# Patient Record
Sex: Male | Born: 1995 | Race: White | Hispanic: No | Marital: Single | Smoking: Never smoker
Health system: Southern US, Community
[De-identification: ages and names within clinical notes are randomized; demographics above are authoritative.]

## PROBLEM LIST (undated history)

## (undated) DIAGNOSIS — L708 Other acne: Secondary | ICD-10-CM

## (undated) DIAGNOSIS — S86899A Other injury of other muscle(s) and tendon(s) at lower leg level, unspecified leg, initial encounter: Secondary | ICD-10-CM

## (undated) DIAGNOSIS — T7840XA Allergy, unspecified, initial encounter: Secondary | ICD-10-CM

## (undated) DIAGNOSIS — S060X9A Concussion with loss of consciousness of unspecified duration, initial encounter: Secondary | ICD-10-CM

## (undated) DIAGNOSIS — L0591 Pilonidal cyst without abscess: Secondary | ICD-10-CM

## (undated) DIAGNOSIS — K5900 Constipation, unspecified: Secondary | ICD-10-CM

## (undated) DIAGNOSIS — F430 Acute stress reaction: Secondary | ICD-10-CM

## (undated) HISTORY — DX: Pilonidal cyst without abscess: L05.91

## (undated) HISTORY — DX: Acute stress reaction: F43.0

## (undated) HISTORY — DX: Other acne: L70.8

## (undated) HISTORY — DX: Other injury of other muscle(s) and tendon(s) at lower leg level, unspecified leg, initial encounter: S86.899A

## (undated) HISTORY — DX: Constipation, unspecified: K59.00

## (undated) HISTORY — DX: Allergy, unspecified, initial encounter: T78.40XA

## (undated) HISTORY — PX: PILONIDAL CYST EXCISION: SHX744

## (undated) HISTORY — DX: Concussion with loss of consciousness of unspecified duration, initial encounter: S06.0X9A

---
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
## 2016-02-26 ENCOUNTER — Ambulatory Visit (INDEPENDENT_AMBULATORY_CARE_PROVIDER_SITE_OTHER): Payer: BLUE CROSS/BLUE SHIELD | Admitting: Physician Assistant

## 2016-02-26 VITALS — BP 104/68 | HR 60 | Temp 98.7°F | Resp 16 | Ht 67.5 in | Wt 148.0 lb

## 2016-02-26 DIAGNOSIS — Z0000 Encounter for general adult medical examination without abnormal findings: Secondary | ICD-10-CM

## 2016-02-26 NOTE — Patient Instructions (Addendum)
Please get a flu vaccine in the next month.   IF you received an x-ray today, you will receive an invoice from [facility]. Please contact [facility] at [phone] with questions or concerns regarding your invoice.   IF you received labwork today, you will receive an invoice from [facility]. Please contact [facility] at [phone] with questions or concerns regarding your invoice.   Our billing staff will not be able to assist you with questions regarding bills from these companies.  You will be contacted with the lab results as soon as they are available. The fastest way to get your results is to activate your My Chart account. Instructions are located on the last page of this paperwork. If you have not heard from us regarding the results in 2 weeks, please contact this office.    Keeping you healthy  Get these tests  Blood pressure- Have your blood pressure checked once a year by your healthcare provider.  Normal blood pressure is 120/80.  Weight- Have your body mass index (BMI) calculated to screen for obesity.  BMI is a measure of body fat based on height and weight. You can also calculate your own BMI at https://www.west-esparza.com/www.nhlbisupport.com/bmi/.  Cholesterol- Have your cholesterol checked regularly starting at age 19, sooner may be necessary if you have diabetes, high blood pressure, if a family member developed heart diseases at an early age or if you smoke.   Chlamydia, HIV, and other sexual transmitted disease- Get screened each year until the age of 19 then within three months of each new sexual partner.  Diabetes- Have your blood sugar checked regularly if you have high blood pressure, high cholesterol, a family history of diabetes or if you are overweight.  Get these vaccines  Flu shot- Every fall.  Tetanus shot- Every 10 years.  Menactra- Single dose; prevents meningitis.  Take these steps  Don't smoke- If you do smoke, ask your  healthcare provider about quitting. For tips on how to quit, go to www.smokefree.gov or call 1-800-QUIT-NOW.  Be physically active- Exercise 5 days a week for at least 30 minutes.  If you are not already physically active start slow and gradually work up to 30 minutes of moderate physical activity.  Examples of moderate activity include walking briskly, mowing the yard, dancing, swimming bicycling, etc.  Eat a healthy diet- Eat a variety of healthy foods such as fruits, vegetables, low fat milk, low fat cheese, yogurt, lean meats, poultry, fish, beans, tofu, etc.  For more information on healthy eating, go to www.thenutritionsource.org  Drink alcohol in moderation- Limit alcohol intake two drinks or less a day.  Never drink and drive.  Dentist- Brush and floss teeth twice daily; visit your dentis twice a year.  Depression-Your emotional health is as important as your physical health.  If you're feeling down, losing interest in things you normally enjoy please talk with your healthcare provider.  Gun Safety- If you keep a gun in your home, keep it unloaded and with the safety lock on.  Bullets should be stored separately.  Helmet use- Always wear a helmet when riding a motorcycle, bicycle, rollerblading or skateboarding.  Safe sex- If you may be exposed to a sexually transmitted infection, use a condom  Seat belts- Seat bels can save your life; always wear one.  Smoke/Carbon Monoxide detectors- These detectors need to be installed on the appropriate level of your home.  Replace batteries at least once a year.  Skin Cancer- When out in  the sun, cover up and use sunscreen SPF 15 or higher.  Violence- If anyone is threatening or hurting you, please tell your healthcare provider.

## 2016-02-26 NOTE — Progress Notes (Signed)
Patient ID: Todd Reese, male    DOB: 1996-04-04, 20 y.o.   MRN: [id number]  PCP: Nilda Simmer, MD  Chief Complaint  Patient presents with  . Annual Exam    and sports form    Subjective:   HPI: Presents for Todd Reese. He needs a form completed for intercollegiate sports participation.  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  Last wellness exam was 01/2015.  Colorectal Cancer Screening: not yet a candidate. Prostate Cancer Screening: not yet a candidate. Bone Density Testing: not yet a candidate. HIV Screening: not yet STI Screening: very low risk-monogamous relationship x 2 years Seasonal Influenza Vaccination: declines Td/Tdap Vaccination: 2009 HPV vaccination series complete Meningococcal vaccination series complete Pneumococcal Vaccination: not yet a candidate. Zoster Vaccination: not yet a candidate.   There are no active problems to display for this patient.   Past Medical History:  Diagnosis Date  . Allergy    Claritin.  . Concussion 07/05/2011  . Other acne   . 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 [facility]. Doristine Section MD. Follow up. Assessment: Improvement in periostitis tibiae. Physical therapy do some conditioning on his legs, but no running, and continue modalities. No running and we are going to probably return him in two weeks. Recheck 03/24/2012.   Marland Kitchen Pilonidal cyst without abscess 04/23/2012  . Stress reaction 04/23/2012   Multiple family stressors in past six months including death of maternal grandmother, development of major depressive episode in mother.     Marland Kitchen Unspecified constipation      Prior to Admission medications   Medication Sig Start Date End Date Taking? Authorizing Provider  ibuprofen (ADVIL,MOTRIN) 200 MG tablet Take 200 mg by mouth every 6 (six) hours as needed.   Yes Historical Provider, MD    No Known Allergies  Past Surgical History:  Procedure  Laterality Date  . MASTOIDECTOMY  2004   Right  . recircumscision  2008    Family History  Problem Relation Age of Onset  . Depression Mother   . Irritable bowel syndrome Mother   . Depression Father   . Heart disease Father   . Hyperlipidemia Father   . Sleep apnea Father   . Colon polyps Father   . ADD / ADHD Sister   . Headache Sister   . Scoliosis Sister     Social History   Social History  . Marital status: Single    Spouse name: n/a  . Number of children: 0  . Years of education: N/A   Occupational History  . student     Product manager   Social History Main Topics  . Smoking status: Never Smoker  . Smokeless tobacco: Never Used  . Alcohol use No  . Drug use: No  . Sexual activity: Yes    Partners: Female    Birth control/ protection: Condom   Other Topics Concern  . None   Social History Narrative   Marital status: Single; + dating Megan from 2015.      Lives: joint custody with mom and dad.      Education: [employer]; transfer to [employer].  Career plans business and basketball training.      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      Seatbelt: driving; seatbelt 409%; no  texting.      Sexual active: yes; +condoms.  No birth control with current partner other than condoms.  Total partners = 2.             Review of Systems  Constitutional: Negative.        "I stay hungry"  HENT: Negative.   Eyes: Negative.   Respiratory: Negative.   Cardiovascular: Negative.   Gastrointestinal: Negative.   Endocrine: Negative.   Genitourinary: Negative.   Musculoskeletal: Positive for back pain (since a significant back spasm in 8th grade). Negative for arthralgias, gait problem, joint swelling, myalgias, neck pain and neck stiffness.  Skin: Negative.   Allergic/Immunologic: Negative.   Neurological: Negative.   Hematological: Negative.   Psychiatric/Behavioral: Negative.           Objective:  Physical Exam  Constitutional: He is oriented to person, place, and time. He appears well-developed and well-nourished. He is active and cooperative.  Non-toxic appearance. He does not have a sickly appearance. He does not appear ill. No distress.  BP 104/68 (BP Location: Right Arm, Patient Position: Sitting, Cuff Size: Normal)   Pulse 60   Temp 98.7 F (37.1 C) (Oral)   Resp 16   Ht 5' 7.5" (1.715 m)   Wt 148 lb (67.1 kg)   SpO2 97%   BMI 22.84 kg/m    HENT:  Head: Normocephalic and atraumatic.  Right Ear: Hearing, tympanic membrane, external ear and ear canal normal.  Left Ear: Hearing, tympanic membrane, external ear and ear canal normal.  Nose: Nose normal.  Mouth/Throat: Uvula is midline, oropharynx is clear and moist and mucous membranes are normal. He does not have dentures. No oral lesions. No trismus in the jaw. Normal dentition. No dental abscesses, uvula swelling, lacerations or dental caries.  Eyes: Conjunctivae, EOM and lids are normal. Pupils are equal, round, and reactive to light. Right eye exhibits no discharge. Left eye exhibits no discharge. No scleral icterus.  Fundoscopic exam:      The right eye shows no arteriolar narrowing, no AV nicking, no exudate, no hemorrhage and no papilledema.       The left eye shows no arteriolar narrowing, no AV nicking, no exudate, no hemorrhage and no papilledema.  Neck: Normal range of motion, full passive range of motion without pain and phonation normal. Neck supple. No spinous process tenderness and no muscular tenderness present. No neck rigidity. No tracheal deviation, no edema, no erythema and normal range of motion present. No thyromegaly present.  Cardiovascular: Normal rate, regular rhythm, S1 normal, S2 normal, normal heart sounds, intact distal pulses and normal pulses.  Exam reveals no gallop and no friction rub.   No murmur heard. Pulmonary/Chest: Effort normal and breath sounds normal. No  respiratory distress. He has no wheezes. He has no rales.  Abdominal: Soft. Normal appearance and bowel sounds are normal. He exhibits no distension and no mass. There is no hepatosplenomegaly. There is no tenderness. There is no rebound and no guarding. No hernia. Hernia confirmed negative in the right inguinal area and confirmed negative in the left inguinal area.  Genitourinary: Testes normal and penis normal. Circumcised.  Musculoskeletal: Normal range of motion. He exhibits no edema or tenderness.       Right shoulder: Normal.       Left shoulder: Normal.       Right elbow: Normal.      Left elbow: Normal.       Right wrist: Normal.  Left wrist: Normal.       Right hip: Normal.       Left hip: Normal.       Right knee: Normal.       Left knee: Normal.       Right ankle: Normal. Achilles tendon normal.       Left ankle: Normal. Achilles tendon normal.       Cervical back: Normal. He exhibits normal range of motion, no tenderness, no bony tenderness, no swelling, no edema, no deformity, no laceration, no pain, no spasm and normal pulse.       Thoracic back: Normal.       Lumbar back: Normal.       Right hand: Normal.       Left hand: Normal.       Right foot: Normal.       Left foot: Normal.  Normal duck walking and single leg hop. Hamstrings are tight.  Lymphadenopathy:       Head (right side): No submental, no submandibular, no tonsillar, no preauricular, no posterior auricular and no occipital adenopathy present.       Head (left side): No submental, no submandibular, no tonsillar, no preauricular, no posterior auricular and no occipital adenopathy present.    He has no cervical adenopathy.       Right: No inguinal and no supraclavicular adenopathy present.       Left: No inguinal and no supraclavicular adenopathy present.  Neurological: He is alert and oriented to person, place, and time. He has normal strength and normal reflexes. He displays no tremor. No cranial nerve  deficit or sensory deficit. He exhibits normal muscle tone. Coordination and gait normal.  Reflex Scores:      Bicep reflexes are 2+ on the right side and 2+ on the left side.      Patellar reflexes are 2+ on the right side and 2+ on the left side.      Achilles reflexes are 2+ on the right side and 2+ on the left side. Skin: Skin is warm, dry and intact. No abrasion, no ecchymosis, no laceration, no lesion and no rash noted. He is not diaphoretic. No cyanosis or erythema. No pallor. Nails show no clubbing.  Psychiatric: He has a normal mood and affect. His speech is normal and behavior is normal. Judgment and thought content normal. Cognition and memory are normal.       Wt Readings from Last 3 Encounters:  02/26/16 148 lb (67.1 kg) (41 %, Z= -0.22)*  01/23/15 151 lb 6.4 oz (68.7 kg) (54 %, Z= 0.11)*  06/03/13 155 lb 9.6 oz (70.6 kg) (74 %, Z= 0.66)*   * Growth percentiles are based on CDC 2-20 Years data.     Visual Acuity Screening   Right eye Left eye Both eyes  Without correction: 20/20 20/20 20/20   With correction:          Assessment & Plan:  1. Annual physical exam Age appropriate anticipatory guidance provided. Sports form completed. No limitations.    Fernande Bras, PA-C Physician Assistant-Certified [facility]

## 2016-05-06 ENCOUNTER — Ambulatory Visit (INDEPENDENT_AMBULATORY_CARE_PROVIDER_SITE_OTHER): Payer: BLUE CROSS/BLUE SHIELD

## 2016-05-06 ENCOUNTER — Encounter (HOSPITAL_COMMUNITY): Payer: Self-pay | Admitting: Emergency Medicine

## 2016-05-06 ENCOUNTER — Ambulatory Visit (HOSPITAL_COMMUNITY)
Admission: EM | Admit: 2016-05-06 | Discharge: 2016-05-06 | Disposition: A | Discharge status: Home / Self Care | Payer: BLUE CROSS/BLUE SHIELD | Attending: Family Medicine | Admitting: Family Medicine

## 2016-05-06 DIAGNOSIS — S6992XA Unspecified injury of left wrist, hand and finger(s), initial encounter: Secondary | ICD-10-CM | POA: Diagnosis not present

## 2016-05-06 MED ORDER — NAPROXEN 500 MG PO TABS: 500.0000 mg | ORAL_TABLET | Freq: Two times a day (BID) | ORAL | 0 refills | Status: DC

## 2016-05-06 NOTE — ED Triage Notes (Signed)
Left hand injury.  Reports playing basketball.  Patient reports hitting left index finger approximately 4 different times over 2 months.  Most recent event happened today.  Now has swelling, pain, and limited mobility of this finger

## 2016-05-06 NOTE — ED Provider Notes (Signed)
CSN: [id number]     Arrival date & time 05/06/16  1911 History   First MD Initiated Contact with Patient 05/06/16 1958     Chief Complaint  Patient presents with  . Finger Injury   (Consider location/radiation/quality/duration/timing/severity/associated sxs/prior Treatment) Patient has sustained injury to left index finger and hand and now has a swollen index finger knuckle and decreased ROM right second finger.   The history is provided by the patient.  Hand Pain  This is a new problem. The current episode started 6 to 12 hours ago. The problem occurs constantly. The problem has not changed since onset.The symptoms are aggravated by bending and exertion. The symptoms are relieved by rest. He has tried nothing for the symptoms.    Past Medical History:  Diagnosis Date  . Allergy    Claritin.  . Concussion 07/05/2011  . Other acne   . 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 [facility]. Doristine SectionVincent Paul MD. Follow up. Assessment: Improvement in periostitis tibiae. Physical therapy do some conditioning on his legs, but no running, and continue modalities. No running and we are going to probably return him in two weeks. Recheck 03/24/2012.   Marland Kitchen. Pilonidal cyst without abscess 04/23/2012  . Stress reaction 04/23/2012   Multiple family stressors in past six months including death of maternal grandmother, development of major depressive episode in mother.     Marland Kitchen. Unspecified constipation    Past Surgical History:  Procedure Laterality Date  . MASTOIDECTOMY  2004   Right  . recircumscision  2008   Family History  Problem Relation Age of Onset  . Depression Mother   . Irritable bowel syndrome Mother   . Depression Father   . Heart disease Father   . Hyperlipidemia Father   . Sleep apnea Father   . Colon polyps Father   . ADD / ADHD Sister   . Headache Sister   . Scoliosis Sister    Social History  Substance Use Topics  . Smoking status: Never  Smoker  . Smokeless tobacco: Never Used  . Alcohol use No    Review of Systems  Constitutional: Negative.   HENT: Negative.   Eyes: Negative.   Respiratory: Negative.   Cardiovascular: Negative.   Gastrointestinal: Negative.   Endocrine: Negative.   Genitourinary: Negative.   Musculoskeletal: Positive for joint swelling.  Skin: Negative.   Allergic/Immunologic: Negative.   Neurological: Negative.   Hematological: Negative.   Psychiatric/Behavioral: Negative.     Allergies  Patient has no known allergies.  Home Medications   Prior to Admission medications   Medication Sig Start Date End Date Taking? Authorizing Provider  ibuprofen (ADVIL,MOTRIN) 200 MG tablet Take 200 mg by mouth every 6 (six) hours as needed.    Historical Provider, MD   Meds Ordered and Administered this Visit  Medications - No data to display  BP 133/79 (BP Location: Right Arm)   Pulse (!) 55   Temp 98.5 F (36.9 C) (Oral)   Resp 16   SpO2 100%  No data found.   Physical Exam  Constitutional: He is oriented to person, place, and time. He appears well-developed and well-nourished.  HENT:  Head: Normocephalic and atraumatic.  Right Ear: External ear normal.  Left Ear: External ear normal.  Mouth/Throat: Oropharynx is clear and moist.  Eyes: EOM are normal. Pupils are equal, round, and reactive to light.  Neck: Normal range of motion. Neck supple.  Cardiovascular: Normal rate, regular rhythm and normal heart  sounds.   Pulmonary/Chest: Effort normal and breath sounds normal.  Abdominal: Soft. Bowel sounds are normal.  Musculoskeletal:  Left second MTP swollen and decreased ROM Left hand  Neurological: He is alert and oriented to person, place, and time.  Nursing note and vitals reviewed.   Urgent Care Course   Clinical Course     Procedures (including critical care time)  Labs Review Labs Reviewed - No data to display  Imaging Review No results found.   Visual Acuity  Review  Right Eye Distance:   Left Eye Distance:   Bilateral Distance:    Right Eye Near:   Left Eye Near:    Bilateral Near:         MDM  Left second finger sprain  Naprosyn 500mg  one po bid x 7 days #14 [address] Street[address] Tape      Deatra CanterWilliam J Oxford, FNP 05/06/16 2039

## 2016-05-06 NOTE — Discharge Instructions (Signed)
Finger Sprain  A finger sprain is an injury to one of the strong bands of tissue (ligaments) that connect the bones in the finger. The ligament can be stretched too much, or it can tear. A tear can be either partial or complete. The severity of the sprain depends on how much of the ligament was damaged or torn.  CAUSES  This injury is often caused by a fall or an accident. For example, if you extend your hands to catch an object or to protect yourself during a fall, the force of impact may cause the ligaments in your finger to stretch too much.  RISK FACTORS  The following factors may make you more likely to have this injury:   Playing sports that involve a greater risk of falling, such as skiing.   Playing sports that involve catching an object, such as basketball.   Having poor strength and flexibility.  SYMPTOMS  Symptoms of this condition include:   Pain at the affected finger joint, especially when bending or extending the finger.   Loss of motion in the finger.   Swelling.   Tenderness.   Bruising.  DIAGNOSIS  This condition is diagnosed with a medical history and physical exam. You may also have an X-ray of your finger to rule out a fracture or dislocation.  TREATMENT  Treatment varies depending on the severity of the sprain. If your ligament is overstretched or partially torn, treatment usually involves:   Keeping the finger in a fixed position (immobilization) for a period of time. To help you do this, your health care provider may apply a bandage, splint, or cast to keep the finger from moving until it heals. In some cases, the finger may be taped to the fingers beside it (buddy taping).   Taking medicines for pain.   Doing exercises for the finger after it has begun to heal.  If your ligament is fully torn, you may need surgery to reconnect the ligament to the bone. After surgery, a cast or splint will be applied.  HOME CARE INSTRUCTIONS  If You Have a Splint:   Wear the splint as told by  your health care provider. Remove it only as told by your health care provider.   Loosen the splint if your fingers tingle, become numb, or turn cold and blue.   Do not let your splint get wet if it is not waterproof.   Keep the splint clean.  If You Have a Cast:   Do not stick anything inside the cast to scratch your skin. Doing that increases your risk of infection.   Check the skin around the cast every day. Tell your health care provider about any concerns.   You may put lotion on dry skin around the edges of the cast. Do not put lotion on the skin underneath the cast.   Do not let your cast get wet if it is not waterproof.   Keep the cast clean.  Bathing   If your splint or cast is not waterproof, cover it with a watertight plastic bag when you take a bath or a shower.   Keep any bandages (dressings) dry until your health care provider says they can be removed.  Managing Pain, Stiffness, and Swelling   If directed, put ice on the injured area:  ? Put ice in a plastic bag.  ? Place a towel between your skin and the bag.  ? Leave the ice on for 20 minutes, 2-3 times a day.     Move your fingers often to avoid stiffness and to lessen swelling.   Raise (elevate) the injured area above the level of your heart while you are sitting or lying down.  General Instructions   Do not put pressure on any part of the cast or splint until it is fully hardened. This may take several hours.   Take over-the-counter and prescription medicines only as told by your health care provider.   Do not drive or operate heavy machinery while taking prescription pain medicine.   Do exercises as told by your health care provider or physical therapist.   Do not wear rings on your injured finger.   Keep all follow-up visits as told by your health care provider. This is important.  SEEK MEDICAL CARE IF:   Your pain is not controlled with medicine.   Your bruising or swelling gets worse.   Your cast or splint is  damaged.   Your finger is numb or blue.   Your finger feels colder than normal.  This information is not intended to replace advice given to you by your health care provider. Make sure you discuss any questions you have with your health care provider.  Document Released: 06/27/2004 Document Revised: 09/11/2015 Document Reviewed: 03/30/2015  Elsevier Interactive Patient Education  2017 [employer]vier Inc[employer]

## 2016-09-03 ENCOUNTER — Telehealth: Payer: Self-pay | Admitting: Family Medicine

## 2016-09-03 NOTE — Telephone Encounter (Signed)
Mother requesting copy of immunization record. Printed and provided.

## 2017-02-24 ENCOUNTER — Ambulatory Visit (HOSPITAL_COMMUNITY)
Admission: EM | Admit: 2017-02-24 | Discharge: 2017-02-24 | Disposition: A | Discharge status: Home / Self Care | Payer: BLUE CROSS/BLUE SHIELD | Attending: Family Medicine | Admitting: Family Medicine

## 2017-02-24 ENCOUNTER — Emergency Department (HOSPITAL_COMMUNITY)
Admission: EM | Admit: 2017-02-24 | Discharge: 2017-02-24 | Disposition: A | Discharge status: Home / Self Care | Payer: BLUE CROSS/BLUE SHIELD | Attending: Emergency Medicine | Admitting: Emergency Medicine

## 2017-02-24 ENCOUNTER — Encounter (HOSPITAL_COMMUNITY): Payer: Self-pay | Admitting: Emergency Medicine

## 2017-02-24 ENCOUNTER — Emergency Department (HOSPITAL_COMMUNITY): Payer: BLUE CROSS/BLUE SHIELD

## 2017-02-24 DIAGNOSIS — Z5321 Procedure and treatment not carried out due to patient leaving prior to being seen by health care provider: Secondary | ICD-10-CM | POA: Diagnosis not present

## 2017-02-24 DIAGNOSIS — Y9361 Activity, american tackle football: Secondary | ICD-10-CM | POA: Diagnosis not present

## 2017-02-24 DIAGNOSIS — S20211A Contusion of right front wall of thorax, initial encounter: Secondary | ICD-10-CM | POA: Diagnosis not present

## 2017-02-24 DIAGNOSIS — R0781 Pleurodynia: Secondary | ICD-10-CM | POA: Diagnosis present

## 2017-02-24 NOTE — ED Triage Notes (Signed)
Patient having pain in right rib area after being kneed in the right ribs.  Patient complaining of 9/10 pain.

## 2017-02-24 NOTE — ED Triage Notes (Signed)
Patient reports when playing football last night, he took a knee to the right ribcage.  Denies sob.  Reports slight pain, and family member feels a knot at edge of ribcage that she is concerned about.  Patient is very limited on responses.  Patient not contributing much to assessment.  Patient did go to ed last night and had an xray, but did not see anyone due to tine in department

## 2017-02-24 NOTE — ED Provider Notes (Signed)
[facility]   [id number] 02/24/17 Arrival Time: 1133  ASSESSMENT & PLAN:  1. Rib contusion, right, initial encounter    Ibuprofen with food for the next week. Activity as tolerated. May f/u as needed. No fractures seen on x-rays. Reviewed expectations re: course of current medical issues. Questions answered. Outlined signs and symptoms indicating need for more acute intervention. Patient verbalized understanding. After Visit Summary given.   SUBJECTIVE:  Todd Reese is a 21 y.o. male who presents with complaint of R sided rib pain. Playing flag football yesterday. Knee to R ribs. Immediate pain. Better this am. Micah Flesher to ED last evening where he had x-rays done. Left due to waiting time. No SOB or trouble breathing. Moderate discomfort with certain movements. Ibuprofen with mild help.  ROS: As per HPI.   OBJECTIVE:  Vitals:   02/24/17 1318  BP: 114/64  Pulse: (!) 46  Resp: 16  Temp: 98 F (36.7 C)  TempSrc: Oral  SpO2: 98%    General appearance: alert; no distress Lungs: clear to auscultation bilaterally Heart: regular rate and rhythm Chest wall: tender over R lower anterior ribs; no bruising or signs of trauma Abdomen: soft, non-tender Skin: warm and dry Psychological: alert and cooperative; normal mood and affect  Imaging: Dg Ribs Unilateral W/chest Right  Result Date: 02/24/2017 CLINICAL DATA:  Right rib pain after being need in the right ribs tonight. Site of pain marked by a BB. EXAM: RIGHT RIBS AND CHEST - 3+ VIEW COMPARISON:  None. FINDINGS: No fracture or other bone lesions are seen involving the ribs. There is no evidence of pneumothorax or pleural effusion. Both lungs are clear. Heart size and mediastinal contours are within normal limits. IMPRESSION: No acute displaced rib fracture. Clear lungs without pneumothorax or pulmonary consolidation. Electronically Signed   By: Tollie Eth M.D.   On: 02/24/2017 00:35   No Known Allergies  Past  Medical History:  Diagnosis Date  . Allergy    Claritin.  . Concussion 07/05/2011  . Other acne   . 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 [facility]. Doristine Section MD. Follow up. Assessment: Improvement in periostitis tibiae. Physical therapy do some conditioning on his legs, but no running, and continue modalities. No running and we are going to probably return him in two weeks. Recheck 03/24/2012.   Marland Kitchen Pilonidal cyst without abscess 04/23/2012  . Stress reaction 04/23/2012   Multiple family stressors in past six months including death of maternal grandmother, development of major depressive episode in mother.     Marland Kitchen Unspecified constipation    Social History   Social History  . Marital status: Single    Spouse name: n/a  . Number of children: 0  . Years of education: N/A   Occupational History  . student     Product manager   Social History Main Topics  . Smoking status: Never Smoker  . Smokeless tobacco: Never Used  . Alcohol use No  . Drug use: No  . Sexual activity: Yes    Partners: Female    Birth control/ protection: Condom   Other Topics Concern  . Not on file   Social History Narrative   Marital status: Single; + dating Megan from 33.      Lives: joint custody with mom and dad.      Education: [employer]; transfer to [employer].  Career plans business and basketball training.      Employment: none      Tobacco:  none      Alcohol: none      Drugs: none      Exercise: [employer]; trainer; six days per week.      Seatbelt: driving; seatbelt 161%; no texting.      Sexual active: yes; +condoms.  No birth control with current partner other than condoms.  Total partners = 2.         Family History  Problem Relation Age of Onset  . Depression Mother   . Irritable bowel syndrome Mother   . Depression Father   . Heart disease Father   . Hyperlipidemia Father   . Sleep apnea Father   . Colon polyps Father   . ADD / ADHD Sister   .  Headache Sister   . Scoliosis Sister    Past Surgical History:  Procedure Laterality Date  . MASTOIDECTOMY  2004   Right  . recircumscision  2008     Mardella Layman, MD 02/24/17 [phone]

## 2017-02-24 NOTE — ED Notes (Signed)
Pt states that he is not going to stay and will see his PCP in the morning.

## 2017-02-25 ENCOUNTER — Ambulatory Visit: Payer: BLUE CROSS/BLUE SHIELD | Admitting: Physician Assistant

## 2017-10-17 ENCOUNTER — Other Ambulatory Visit: Payer: Self-pay

## 2017-10-17 ENCOUNTER — Emergency Department
Admission: EM | Admit: 2017-10-17 | Discharge: 2017-10-17 | Disposition: A | Discharge status: Home / Self Care | Payer: BLUE CROSS/BLUE SHIELD | Attending: Emergency Medicine | Admitting: Emergency Medicine

## 2017-10-17 ENCOUNTER — Encounter: Payer: Self-pay | Admitting: Family Medicine

## 2017-10-17 DIAGNOSIS — L0501 Pilonidal cyst with abscess: Secondary | ICD-10-CM | POA: Diagnosis not present

## 2017-10-17 DIAGNOSIS — R222 Localized swelling, mass and lump, trunk: Secondary | ICD-10-CM | POA: Diagnosis present

## 2017-10-17 MED ORDER — CLINDAMYCIN HCL 300 MG PO CAPS: 300.0000 mg | ORAL_CAPSULE | Freq: Three times a day (TID) | ORAL | 0 refills | Status: AC

## 2017-10-17 MED ORDER — OXYCODONE-ACETAMINOPHEN 5-325 MG PO TABS: 1.0000 | ORAL_TABLET | ORAL | 0 refills | Status: AC | PRN

## 2017-10-17 NOTE — ED Provider Notes (Signed)
[facility] Emergency Department Provider Note  ____________________________________________  Time seen: Approximately 7:00 PM  I have reviewed the triage vital signs and the nursing notes.   HISTORY  Chief Complaint Cyst    HPI Todd Reese is a 22 y.o. male that presents to the emergency department for evaluation for evaluation of pilonidal abscess for 2 days. 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 No fevers, chills, nausea, vomiting, abdominal pain, drainage.    Past Medical History:  Diagnosis Date  . Allergy    Claritin.  . Concussion 07/05/2011  . Other acne   . 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 [facility]. Doristine Section MD. Follow up. Assessment: Improvement in periostitis tibiae. Physical therapy do some conditioning on his legs, but no running, and continue modalities. No running and we are going to probably return him in two weeks. Recheck 03/24/2012.   Marland Kitchen Pilonidal cyst without abscess 04/23/2012  . Stress reaction 04/23/2012   Multiple family stressors in past six months including death of maternal grandmother, development of major depressive episode in mother.     Marland Kitchen Unspecified constipation     There are no active problems to display for this patient.   Past Surgical History:  Procedure Laterality Date  . MASTOIDECTOMY  2004   Right  . recircumscision  2008    Prior to Admission medications   Medication Sig Start Date End Date Taking? Authorizing Provider  clindamycin (CLEOCIN) 300 MG capsule Take 1 capsule (300 mg total) by mouth 3 (three) times daily for 10 days. 10/17/17 10/27/17  Enid Derry, PA-C  ibuprofen (ADVIL,MOTRIN) 200 MG tablet Take 200 mg by mouth every 6 (six) hours as needed.    [provider]  naproxen (NAPROSYN) 500 MG tablet Take 1 tablet (500 mg total) by mouth 2 (two) times daily with a meal. 05/06/16   Oxford, Anselm Pancoast,  FNP  oxyCODONE-acetaminophen (PERCOCET) 5-325 MG tablet Take 1 tablet by mouth every 4 (four) hours as needed for severe pain. 10/17/17 10/17/18  Enid Derry, PA-C    Allergies Patient has no known allergies.  Family History  Problem Relation Age of Onset  . Depression Mother   . Irritable bowel syndrome Mother   . Depression Father   . Heart disease Father   . Hyperlipidemia Father   . Sleep apnea Father   . Colon polyps Father   . ADD / ADHD Sister   . Headache Sister   . Scoliosis Sister     Social History Social History   Tobacco Use  . Smoking status: Never Smoker  . Smokeless tobacco: Never Used  Substance Use Topics  . Alcohol use: No  . Drug use: No     Review of Systems  Constitutional: No fever/chills Gastrointestinal: No abdominal pain.  No nausea, no vomiting.  Musculoskeletal: Negative for musculoskeletal pain. Skin: Negative for rash, abrasions, lacerations, ecchymosis.   ____________________________________________   PHYSICAL EXAM:  VITAL SIGNS: ED Triage Vitals  Enc Vitals Group     BP 10/17/17 1802 (!) 130/42     Pulse Rate 10/17/17 1802 (!) 52     Resp 10/17/17 1802 16     Temp 10/17/17 1802 98.5 F (36.9 C)     Temp Source 10/17/17 1802 Oral     SpO2 10/17/17 1802 96 %     Weight 10/17/17 1803 160 lb (72.6 kg)     Height 10/17/17 1803  (  1.753 m)     Head Circumference --      Peak Flow --      Pain Score 10/17/17 1803 7     Pain Loc --      Pain Edu? --      Excl. in GC? --      Constitutional: Alert and oriented. Well appearing and in no acute distress. Eyes: Conjunctivae are normal. PERRL. EOMI. Head: Atraumatic. ENT:      Ears:      Nose: No congestion/rhinnorhea.      Mouth/Throat: Mucous membranes are moist.  Neck: No stridor.  Cardiovascular: Normal rate, regular rhythm.  Good peripheral circulation. Respiratory: Normal respiratory effort without tachypnea or retractions. Lungs CTAB. Good air entry to the bases  with no decreased or absent breath sounds. Musculoskeletal: Full range of motion to all extremities. No gross deformities appreciated. Neurologic:  Normal speech and language. No gross focal neurologic deficits are appreciated.  Skin:  Skin is warm, dry and intact. No palpable area of swelling or fluctuance at gluteal cleft. Well healed scar to gluteal cleft. No erythema. Psychiatric: Mood and affect are normal. Speech and behavior are normal. Patient exhibits appropriate insight and judgement.   ____________________________________________   LABS (all labs ordered are listed, but only abnormal results are displayed)  Labs Reviewed - No data to display ____________________________________________  EKG   ____________________________________________  RADIOLOGY  No results found.  ____________________________________________    PROCEDURES  Procedure(s) performed:    Procedures    Medications - No data to display   ____________________________________________   INITIAL IMPRESSION / ASSESSMENT AND PLAN / ED COURSE  Pertinent labs & imaging results that were available during my care of the patient were reviewed by me and considered in my medical decision making (see chart for details).  Review of the [place] CSRS was performed in accordance of the [facility] prior to dispensing any controlled drugs.   Patient's diagnosis is consistent with pilonidal cyst.  I am unable to palpate any drainable abscess currently.  Patient will begin warm compresses and antibiotics.  We discussed lab work and imaging versus a trial of outpatient oral antibiotics and following up with general surgery.  He will begin outpatient oral antibiotics.  He will return to the emergency department for worsening symptoms. Patient will be discharged home with prescriptions for clindamycin and percocet. Patient is to follow up with general surgery as directed. Patient is given ED precautions to return to the ED for  any worsening or new symptoms.     ____________________________________________  FINAL CLINICAL IMPRESSION(S) / ED DIAGNOSES  Final diagnoses:  Pilonidal abscess      NEW MEDICATIONS STARTED DURING THIS VISIT:  ED Discharge Orders        Ordered    clindamycin (CLEOCIN) 300 MG capsule  3 times daily     10/17/17 1912    oxyCODONE-acetaminophen (PERCOCET) 5-325 MG tablet  Every 4 hours PRN     10/17/17 1914          This chart was dictated using voice recognition software/Dragon. Despite best efforts to proofread, errors can occur which can change the meaning. Any change was purely unintentional.    Enid Derry, PA-C 10/17/17 2338    Jeanmarie Plant, MD 10/18/17 Marlyne Beards

## 2017-10-17 NOTE — ED Triage Notes (Signed)
Pt alert, oriented, ambulatory. States has a pilondial cyst that started hurting Wednesday. States no drainage just pain. Denies fever. Hx of cyst with surgery to fix. States that was 3 years ago, states has had some drainage from it since but not this time.

## 2017-10-20 ENCOUNTER — Ambulatory Visit: Payer: BLUE CROSS/BLUE SHIELD | Admitting: Family Medicine

## 2017-10-22 ENCOUNTER — Encounter: Payer: Self-pay | Admitting: Family Medicine

## 2019-09-13 IMAGING — DX DG RIBS W/ CHEST 3+V*R*
5 series · 5 of 5 positions shown · non-contrast
Comparison: None.

CLINICAL DATA: Right rib pain after being need in the right ribs
tonight. Site of pain marked by a BB.

EXAM:
RIGHT RIBS AND CHEST - 3+ VIEW

[chest pa]
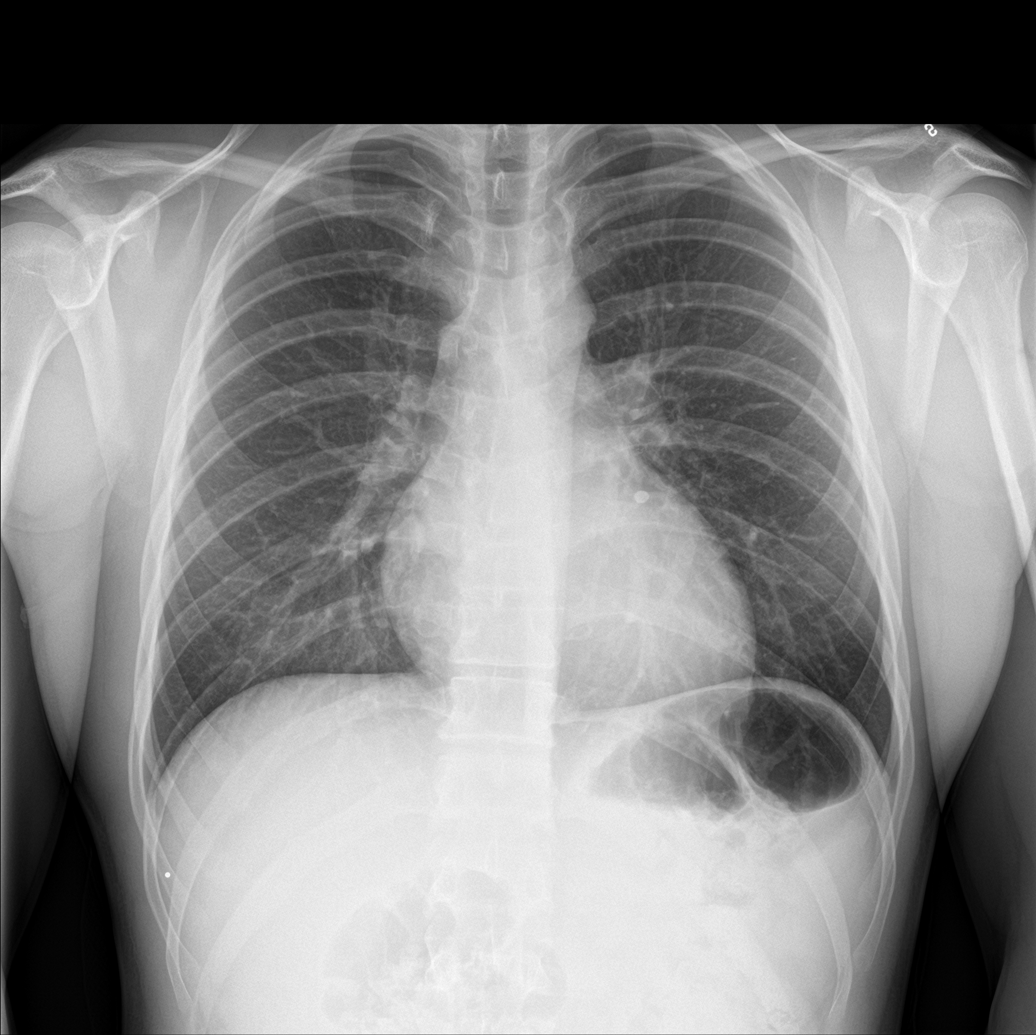

[rib pa]
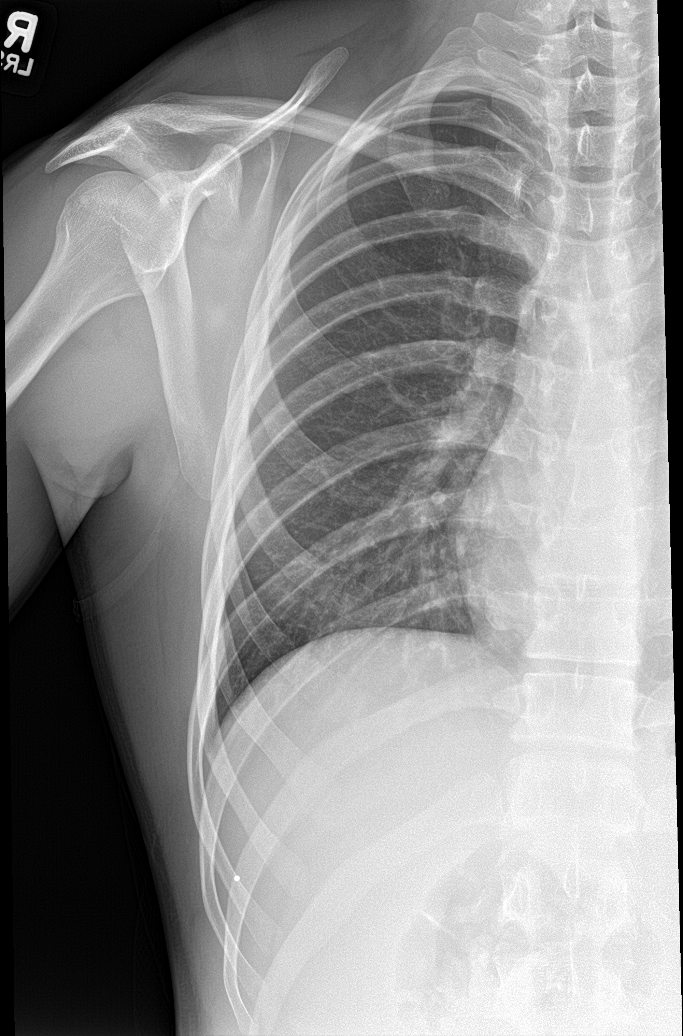

[rib pa obl (1 of 3)]
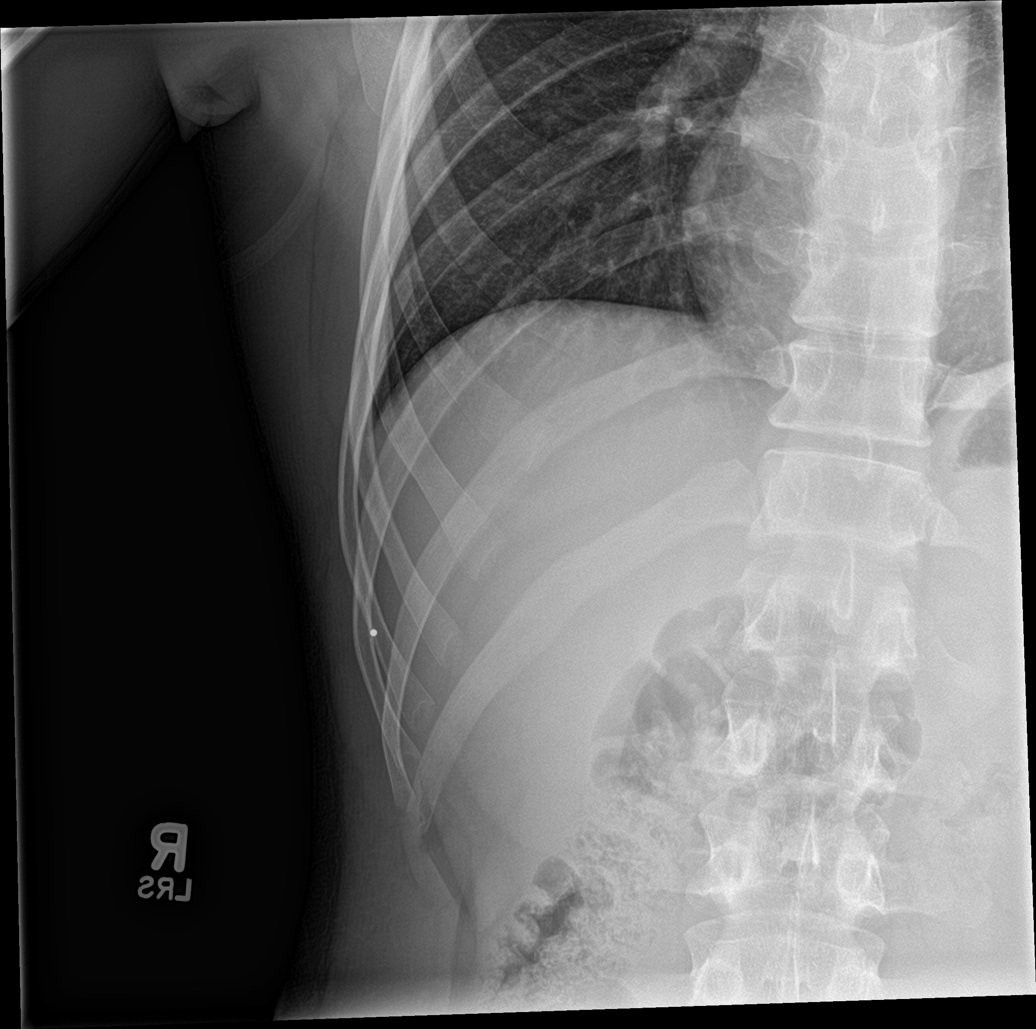

[rib pa obl (2 of 3)]
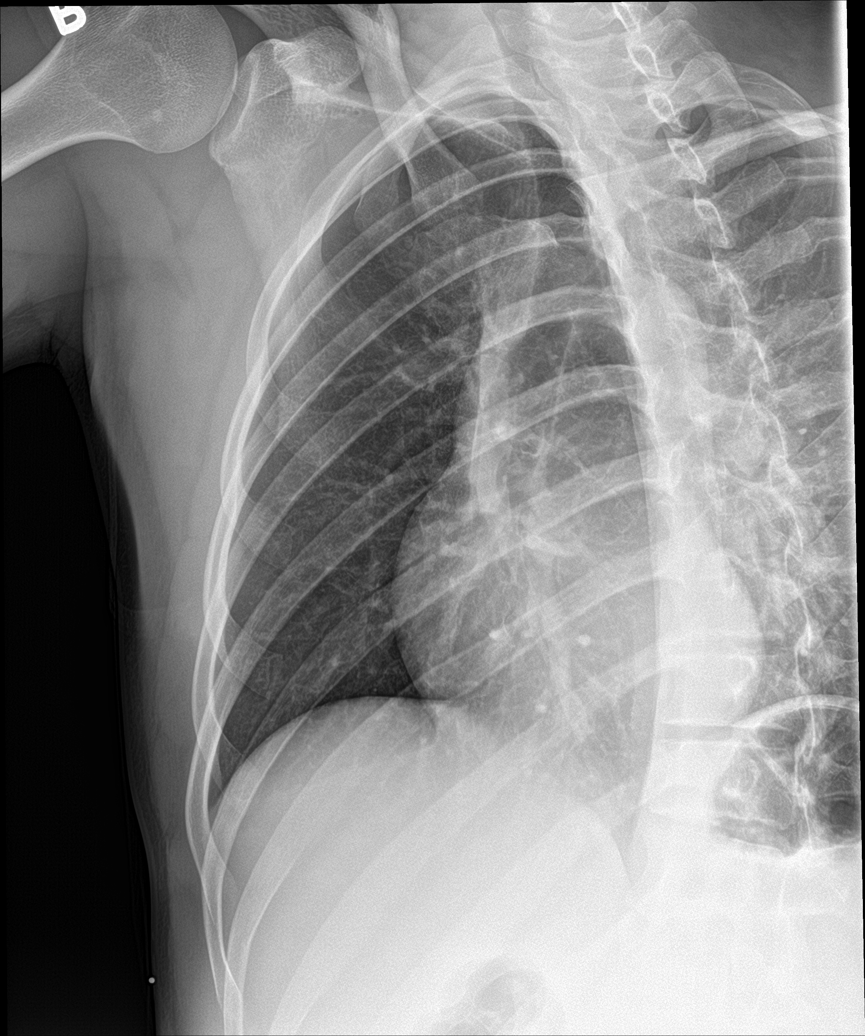

[rib pa obl (3 of 3)]
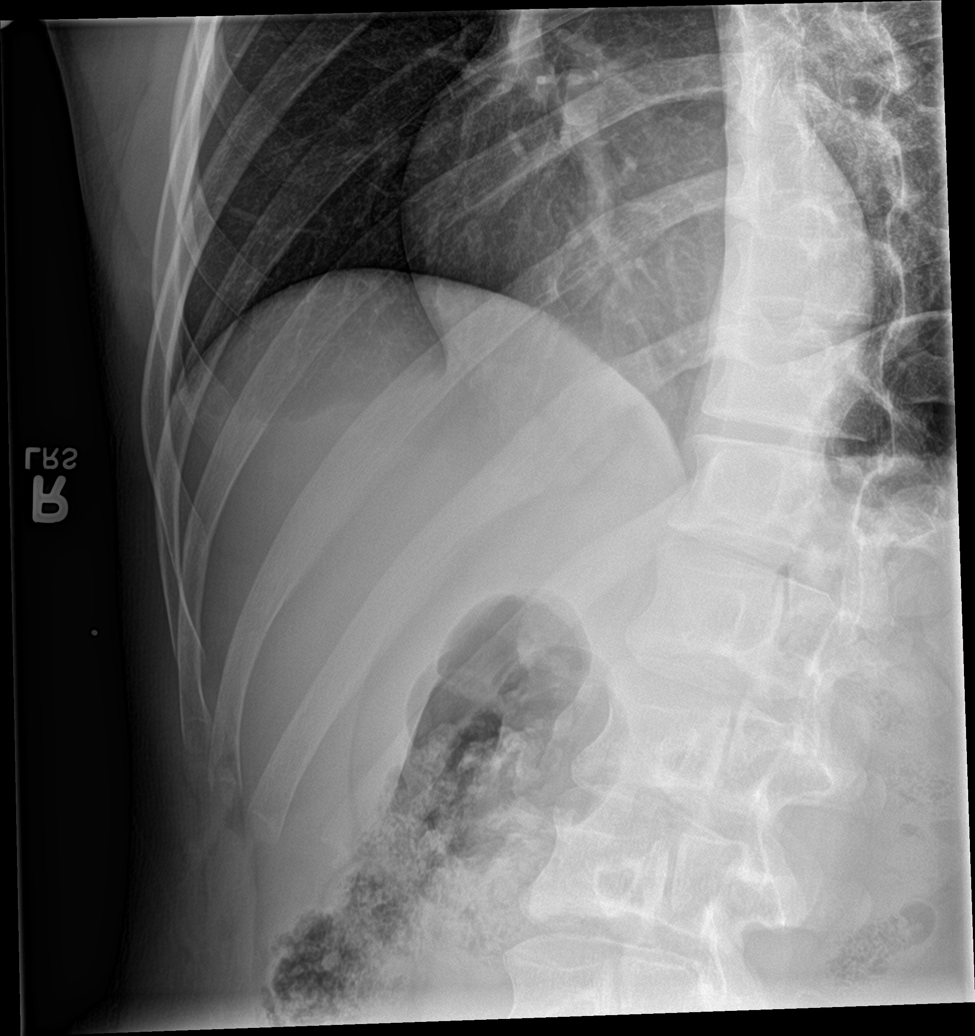

[5 of 5 positions shown; findings below may reference images not displayed]

FINDINGS: No fracture or other bone lesions are seen involving the ribs. There
is no evidence of pneumothorax or pleural effusion. Both lungs are
clear. Heart size and mediastinal contours are within normal limits.
IMPRESSION: No acute displaced rib fracture. Clear lungs without pneumothorax or
pulmonary consolidation.

## 2022-01-09 ENCOUNTER — Encounter: Payer: Self-pay | Admitting: *Deleted

## 2022-01-09 ENCOUNTER — Other Ambulatory Visit: Payer: Self-pay

## 2022-01-09 ENCOUNTER — Ambulatory Visit
Admission: EM | Admit: 2022-01-09 | Discharge: 2022-01-09 | Disposition: A | Discharge status: Home / Self Care | Payer: BC Managed Care – PPO | Attending: Family Medicine | Admitting: Family Medicine

## 2022-01-09 DIAGNOSIS — J0140 Acute pansinusitis, unspecified: Secondary | ICD-10-CM | POA: Diagnosis not present

## 2022-01-09 MED ORDER — AZITHROMYCIN 250 MG PO TABS: ORAL_TABLET | ORAL | 0 refills | Status: DC

## 2022-01-09 MED ORDER — PROMETHAZINE-DM 6.25-15 MG/5ML PO SYRP: 5.0000 mL | ORAL_SOLUTION | Freq: Three times a day (TID) | ORAL | 0 refills | Status: DC | PRN

## 2022-01-09 NOTE — ED Triage Notes (Signed)
Pt reports sinus pain and cough for one week.

## 2022-01-09 NOTE — ED Provider Notes (Signed)
Todd Reese    CSN: [id number] Arrival date & time: 01/09/22  0820      History   Chief Complaint Chief Complaint  Patient presents with   Nasal Congestion    Entered by patient   Cough    HPI Todd Reese is a 26 y.o. male.   HPI Patient presents for evaluation of facial pressure, nasal congestion, cough, nasal drainage x 1 week. He reports no fever. Has had cough, which has improved. No history of asthma. He has taken otc cough and cold medication without improvement of symptoms . Past Medical History:  Diagnosis Date   Allergy    Claritin.   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 [facility]. Doristine Section MD. Follow up. Assessment: Improvement in periostitis tibiae. Physical therapy do some conditioning on his legs, but no running, and continue modalities. No running and we are going to probably return him in two weeks. Recheck 03/24/2012.    Pilonidal cyst without abscess 04/23/2012   Stress reaction 04/23/2012   Multiple family stressors in past six months including death of maternal grandmother, development of major depressive episode in mother.      Unspecified constipation     There are no problems to display for this patient.   Past Surgical History:  Procedure Laterality Date   MASTOIDECTOMY  2004   Right   recircumscision  2008       Home Medications    Prior to Admission medications   Medication Sig Start Date End Date Taking? Authorizing Provider  promethazine-dextromethorphan (PROMETHAZINE-DM) 6.25-15 MG/5ML syrup Take 5 mLs by mouth 3 (three) times daily as needed for cough. 01/09/22  Yes Bing Neighbors, FNP  azithromycin (ZITHROMAX) 250 MG tablet Take 2 tabs PO x 1 dose, then 1 tab PO QD x 4 days 01/09/22   Bing Neighbors, FNP  ibuprofen (ADVIL,MOTRIN) 200 MG tablet Take 200 mg by mouth every 6 (six) hours as needed.    [provider]   naproxen (NAPROSYN) 500 MG tablet Take 1 tablet (500 mg total) by mouth 2 (two) times daily with a meal. 05/06/16   [place], Anselm Pancoast, FNP    Family History Family History  Problem Relation Age of Onset   Depression Mother    Irritable bowel syndrome Mother    Depression Father    Heart disease Father    Hyperlipidemia Father    Sleep apnea Father    Colon polyps Father    ADD / ADHD Sister    Headache Sister    Scoliosis Sister     Social History Social History   Tobacco Use   Smoking status: Never   Smokeless tobacco: Never  Substance Use Topics   Alcohol use: No   Drug use: No     Allergies   Patient has no known allergies.   Review of Systems Review of Systems Pertinent negatives listed in HPI   Physical Exam Triage Vital Signs ED Triage Vitals  Enc Vitals Group     BP 01/09/22 0850 123/76     Pulse Rate 01/09/22 0850 (!) 54     Resp 01/09/22 0850 16     Temp 01/09/22 0850 98.1 F (36.7 C)     Temp Source 01/09/22 0850 Oral     SpO2 01/09/22 0850 97 %     Weight --      Height --  Head Circumference --      Peak Flow --      Pain Score 01/09/22 0854 7     Pain Loc --      Pain Edu? --      Excl. in GC? --    No data found.  Updated Vital Signs BP 123/76 (BP Location: Left Arm)   Pulse (!) 54   Temp 98.1 F (36.7 C) (Oral)   Resp 16   SpO2 97%   Visual Acuity Right Eye Distance:   Left Eye Distance:   Bilateral Distance:    Right Eye Near:   Left Eye Near:    Bilateral Near:     Physical Exam  General Appearance:    Alert, cooperative, no distress  HENT:   Normocephalic, ears normal, nares mucosal edema with congestion, rhinorrhea, oropharynx    Eyes:    PERRL, conjunctiva/corneas clear, EOM's intact       Lungs:     Clear to auscultation bilaterally, respirations unlabored  Heart:    Regular rate and rhythm  Neurologic:   Awake, alert, oriented x 3. No apparent focal neurological           defect.      UC Treatments /  Results  Labs (all labs ordered are listed, but only abnormal results are displayed) Labs Reviewed - No data to display  EKG   Radiology No results found.  Procedures Procedures (including critical care time)  Medications Ordered in UC Medications - No data to display  Initial Impression / Assessment and Plan / UC Course  I have reviewed the triage vital signs and the nursing notes.  Pertinent labs & imaging results that were available during my care of the patient were reviewed by me and considered in my medical decision making (see chart for details).    Treatment plan discussed, and prescribed per discharge medication orders.  Return precautions given. Follow-up as needed. Final Clinical Impressions(s) / UC Diagnoses   Final diagnoses:  Acute non-recurrent pansinusitis     Discharge Instructions      Complete entire course of medication as prescribed and return if symptoms worsen or has in 7 days if symptoms have not improved.     ED Prescriptions     Medication Sig Dispense Auth. Provider   azithromycin (ZITHROMAX) 250 MG tablet  (Status: Discontinued) Take 2 tabs PO x 1 dose, then 1 tab PO QD x 4 days 6 tablet Bing Neighbors, FNP   azithromycin (ZITHROMAX) 250 MG tablet Take 2 tabs PO x 1 dose, then 1 tab PO QD x 4 days 6 tablet Bing Neighbors, FNP   promethazine-dextromethorphan (PROMETHAZINE-DM) 6.25-15 MG/5ML syrup Take 5 mLs by mouth 3 (three) times daily as needed for cough. 140 mL Bing Neighbors, FNP      PDMP not reviewed this encounter.   Bing Neighbors, FNP 01/09/22 [id number]

## 2022-01-09 NOTE — Discharge Instructions (Addendum)
Complete entire course of medication as prescribed and return if symptoms worsen or has in 7 days if symptoms have not improved.

## 2022-07-03 NOTE — Progress Notes (Signed)
Patient ID: Todd Reese, male   DOB: Sep 10, 1995, 27 y.o.   MRN: [id number]  Chief Complaint: Recurrent pilonidal cyst  History of Present Illness Todd Reese is a 27 y.o. male with a prior history of excision of pilonidal disease, with primary closure about 10 years ago.  Now with 3-year progressive development of recurrent disease.  Reports waxing and waning tenderness with intermittent drainage and associated discomfort.  Denies any fevers or chills, lancing procedures, or repeated operations.  Sporadically has minimal purulent drainage, without excruciating pain or disability.  Past Medical History Past Medical History:  Diagnosis Date   Allergy    Claritin.   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 [facility]. Doristine Section MD. Follow up. Assessment: Improvement in periostitis tibiae. Physical therapy do some conditioning on his legs, but no running, and continue modalities. No running and we are going to probably return him in two weeks. Recheck 03/24/2012.    Pilonidal cyst without abscess 04/23/2012   Stress reaction 04/23/2012   Multiple family stressors in past six months including death of maternal grandmother, development of major depressive episode in mother.      Unspecified constipation       Past Surgical History:  Procedure Laterality Date   MASTOIDECTOMY  2004   Right   recircumscision  2008    No Known Allergies  No current outpatient medications on file.   No current facility-administered medications for this visit.    Family History Family History  Problem Relation Age of Onset   Depression Mother    Irritable bowel syndrome Mother    Depression Father    Heart disease Father    Hyperlipidemia Father    Sleep apnea Father    Colon polyps Father    ADD / ADHD Sister    Headache Sister    Scoliosis Sister       Social History Social History   Tobacco Use    Smoking status: Never   Smokeless tobacco: Never  Substance Use Topics   Alcohol use: No   Drug use: No        Review of Systems  Constitutional: Negative.   HENT: Negative.    Eyes: Negative.   Respiratory: Negative.    Cardiovascular: Negative.   Gastrointestinal: Negative.   Genitourinary: Negative.   Skin: Negative.   Neurological: Negative.   Psychiatric/Behavioral: Negative.       Physical Exam Blood pressure 113/73, pulse (!) 41, temperature 97.6 F (36.4 C), temperature source Oral, height 5\' 8"  (1.727 m), weight 157 lb (71.2 kg), SpO2 99 %. Last Weight  Most recent update: 07/04/2022 10:24 AM    Weight  71.2 kg (157 lb)             CONSTITUTIONAL: Well developed, and nourished, appropriately responsive and aware without distress.   EYES: Sclera non-icteric.   EARS, NOSE, MOUTH AND THROAT:  The oropharynx is clear. Oral mucosa is pink and moist.    Hearing is intact to voice.  NECK: Trachea is midline, and there is no jugular venous distension.  LYMPH NODES:  Lymph nodes in the neck are not appreciated. RESPIRATORY:  Lungs are clear, and breath sounds are equal bilaterally.  Normal respiratory effort without pathologic use of accessory muscles. CARDIOVASCULAR: Heart is bradycardic in rate and regular in rhythm.   Well perfused.  GI: The abdomen is soft, nontender, and nondistended.  GU: The natal  cleft is notable for a thickened ridge of scar tissue, creating an os inferiorly towards the anal area that freely drains purulence with slight pressure.  There appears there may be some hair growth on the anal aspect.  And perhaps some foreign body/hair within this opening.  There is no erythema, no fluctuance, no exquisite tenderness present. MUSCULOSKELETAL:  Symmetrical muscle tone appreciated in all four extremities.    SKIN: Skin turgor is normal. No pathologic skin lesions appreciated.  NEUROLOGIC:  Motor and sensation appear grossly normal.  Cranial nerves are  grossly without defect. PSYCH:  Alert and oriented to person, place and time. Affect is appropriate for situation.  Data Reviewed I have personally reviewed what is currently available of the patient's imaging, recent labs and medical records.   Labs:      No data to display             No data to display            Imaging: Radiological images reviewed:   Within last 24 hrs: No results found.  Assessment    Recurrent pilonidal cystic disease, currently with chronic/intermittent drainage. There are no problems to display for this patient.   Plan    We discussed the prior failure of primary closure, and the diminished degree of recurrence with excision and healing by secondary intention.  I discussed postoperative wound care, the duration of wound care necessary to achieve complete healing and the lingering risk of recurrence with even this approach, compared with other approaches.  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  Face-to-face time spent with the patient and accompanying care providers(if present) was 30 minutes, with more than 50% of the time spent counseling, educating, and coordinating care of the patient.    These notes generated with voice recognition software. I apologize for typographical errors.  [name] M.D., FACS 07/04/2022, 12:06 PM

## 2022-07-04 ENCOUNTER — Ambulatory Visit: Payer: BC Managed Care – PPO | Admitting: Surgery

## 2022-07-04 ENCOUNTER — Encounter: Payer: Self-pay | Admitting: Surgery

## 2022-07-04 VITALS — BP 113/73 | HR 41 | Temp 97.6°F | Ht 68.0 in | Wt 157.0 lb

## 2022-07-04 DIAGNOSIS — L0591 Pilonidal cyst without abscess: Secondary | ICD-10-CM

## 2022-07-04 NOTE — Patient Instructions (Signed)
Our surgery scheduler Pamala Hurry will call you within 24-48 hours to get you scheduled. If you have not heard from her after 48 hours, please call our office. Have the blue sheet available when she calls to write down important information.    If you have any concerns or questions, please feel free to call our office.   Pilonidal Cyst Removal Pilonidal cyst removal is a procedure to remove a fluid-filled sac (cyst) that forms under the skin near the tailbone, at the top of the crease between the buttocks (pilonidal area). This procedure is also called a pilonidal cystectomy.  Pilonidal cyst is caused by an ingrown hair that irritates the area. Sometimes a tunnel (sinus) forms under the skin from the cyst and makes a second opening in the skin. In that case, the sinus area may also be removed during the procedure. You may need this procedure if you have a cyst that is large, painful, or keeps getting infected. A cyst that becomes infected is called an abscess. The abscess may need to be opened, drained, and treated with antibiotics before the cyst is removed. Tell your health care provider about: Any allergies you have. All medicines you are taking, including vitamins, herbs, eye drops, creams, and over-the-counter medicines. Any problems you or family members have had with anesthetic medicines. Any bleeding problems you have. Any surgeries you have had. Any medical conditions you have. Whether you are pregnant or may be pregnant. Any recent fever, increase in pain, or discharge from the cyst. What are the risks? Your health care provider will talk with you about risks. These may include: Delay in healing. This is the most common problem. Infection. Bleeding. Allergic reactions to medicines. A closed incision opening. The cyst coming back again (recurrence). What happens before the procedure? When to stop eating and drinking Follow instructions from your health care provider about what you  may eat and drink. These may include: 8 hours before your procedure Stop eating most foods. Do not eat meat, fried foods, or fatty foods. Eat only light foods, such as toast or crackers. All liquids are okay except energy drinks and alcohol. 6 hours before your procedure Stop eating. Drink only clear liquids, such as water, clear fruit juice, black coffee, plain tea, and sports drinks. Do not drink energy drinks or alcohol. 2 hours before your procedure Stop drinking all liquids. You may be allowed to take medicines with small sips of water. If you do not follow your health care provider's instructions, your procedure may be delayed or canceled. Medicines Ask your health care provider about: Changing or stopping your regular medicines. These include any diabetes medicines or blood thinners you take. Taking medicines such as aspirin and ibuprofen. These medicines can thin your blood. Do not take them unless your health care provider tells you to. Taking over-the-counter medicines, vitamins, herbs, and supplements. Surgery safety Ask your health care provider: How your surgery site will be marked. What steps will be taken to help prevent infection. These steps may include: Removing hair at the surgery site. Washing skin with a soap that kills germs. Taking antibiotics. General instructions Do not use any products that contain nicotine or tobacco for at least 4 weeks before the procedure. These products include cigarettes, chewing tobacco, and vaping devices, such as e-cigarettes. If you need help quitting, ask your health care provider. If you will be going home right after the procedure, plan to have a responsible adult: Take you home from the hospital or clinic.  You will not be allowed to drive. Care for you for the time you are told. You may need help with wound care and dressing changes. What happens during the procedure?  An IV will be inserted into one of your veins. You may be  given: A sedative. This helps you relax. Anesthesia. This will: Numb certain areas of your body. Make you fall asleep for surgery. Your surgeon will make an incision near the cyst. Depending on the size of the cyst and if the sinus is infected, one of the following will be done: If there is an abscess, a small hole will be made in the cyst. The pus will be drained out. If the sinus is large or keeps getting infected, your surgeon may: Cut out the sinus and remove some of the skin around it. The wound will be left open to heal on its own. Remove the sinus and cut out a flap on either side of it. The two sides will be stitched together. A thin, flexible tube with a camera (endoscope) may be used before this procedure to better see the area. The surgeon may remove hair and infected tissue. The sinus will then be cleaned with a solution. Heat will be used to seal the sinus. The incision may be left open or closed. An open incision may be packed with gauze and covered with a bandage (dressing). An incision may be closed with stitches (sutures) and covered with a dressing. The area may be sealed with fibrin glue and covered with a dressing. The procedure may vary among health care providers and hospitals. What happens after the procedure? Your blood pressure, heart rate, breathing rate, and blood oxygen level will be monitored until you leave the hospital or clinic. You will be given medicine for pain as needed. If you were given a sedative during the procedure, it can affect you for several hours. Do not drive or operate machinery until your health care provider says that it is safe. Your health care provider will give you instructions for taking care of your dressing at home after the procedure. If your incision was left open and packed with gauze, you will need to change your dressing every day. Summary Pilonidal cyst removal is surgery to remove a fluid-filled sac (cyst) that forms in the crease  between the buttocks. The incision used to remove the cyst may be closed with sutures or left open. If left open, it may be packed with gauze and covered with a dressing. You will be given medicine for pain as needed. Your health care provider will give you instructions for taking care of your dressing at home. This information is not intended to replace advice given to you by your health care provider. Make sure you discuss any questions you have with your health care provider. Document Revised: 08/24/2021 Document Reviewed: 08/24/2021 Elsevier Patient Education  [place].

## 2022-11-05 ENCOUNTER — Encounter: Payer: Self-pay | Admitting: Surgery

## 2022-11-05 ENCOUNTER — Ambulatory Visit: Payer: BC Managed Care – PPO | Admitting: Surgery

## 2022-11-05 ENCOUNTER — Ambulatory Visit: Payer: Self-pay | Admitting: Surgery

## 2022-11-05 VITALS — BP 98/63 | HR 60 | Temp 98.0°F | Ht 68.0 in | Wt 160.0 lb

## 2022-11-05 DIAGNOSIS — L0591 Pilonidal cyst without abscess: Secondary | ICD-10-CM

## 2022-11-05 NOTE — Progress Notes (Signed)
Patient ID: Todd Reese, male   DOB: 05-29-1996, 27 y.o.   MRN: [id number]  Chief Complaint: Recurrent pilonidal cyst  History of Present Illness This gentleman returns today ready to schedule surgery.  Currently feels that this time of year has the least competing with his wound care recovery.  Denies much change in his prior history, still having some intermittent drainage, very little pain, utilizing Epsom salt baths from time to time to keep the area clean. Todd Reese is a 27 y.o. male with a prior history of excision of pilonidal disease, with primary closure about 10 years ago.  Now with 3-year progressive development of recurrent disease.  Reports waxing and waning tenderness with intermittent drainage and associated discomfort.  Denies any fevers or chills, lancing procedures, or repeated operations.  Sporadically has minimal purulent drainage, without excruciating pain or disability.  Past Medical History Past Medical History:  Diagnosis Date   Allergy    Claritin.   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 [facility]. Doristine Section MD. Follow up. Assessment: Improvement in periostitis tibiae. Physical therapy do some conditioning on his legs, but no running, and continue modalities. No running and we are going to probably return him in two weeks. Recheck 03/24/2012.    Pilonidal cyst without abscess 04/23/2012   Stress reaction 04/23/2012   Multiple family stressors in past six months including death of maternal grandmother, development of major depressive episode in mother.      Unspecified constipation       Past Surgical History:  Procedure Laterality Date   MASTOIDECTOMY  2004   Right   recircumscision  2008    No Known Allergies  Current Outpatient Medications  Medication Sig Dispense Refill   Multiple Vitamin (MULTIVITAMIN) capsule Take 1 capsule by mouth daily.     No current  facility-administered medications for this visit.    Family History Family History  Problem Relation Age of Onset   Depression Mother    Irritable bowel syndrome Mother    Depression Father    Heart disease Father    Hyperlipidemia Father    Sleep apnea Father    Colon polyps Father    ADD / ADHD Sister    Headache Sister    Scoliosis Sister       Social History Social History   Tobacco Use   Smoking status: Never    Passive exposure: Never   Smokeless tobacco: Never  Vaping Use   Vaping Use: Never used  Substance Use Topics   Alcohol use: No   Drug use: No        Review of Systems  Constitutional: Negative.   HENT: Negative.    Eyes: Negative.   Respiratory: Negative.    Cardiovascular: Negative.   Gastrointestinal: Negative.   Genitourinary: Negative.   Skin: Negative.   Neurological: Negative.   Psychiatric/Behavioral: Negative.       Physical Exam Blood pressure 98/63, pulse 60, temperature 98 F (36.7 C), height 5\' 8"  (1.727 m), weight 160 lb (72.6 kg), SpO2 97 %. Last Weight  Most recent update: 11/05/2022  3:26 PM    Weight  72.6 kg (160 lb)             CONSTITUTIONAL: Well developed, and nourished, appropriately responsive and aware without distress.   EYES: Sclera non-icteric.   EARS, NOSE, MOUTH AND THROAT:  The oropharynx is clear. Oral mucosa is pink  and moist.    Hearing is intact to voice.  NECK: Trachea is midline, and there is no jugular venous distension.  LYMPH NODES:  Lymph nodes in the neck are not appreciated. RESPIRATORY:  Lungs are clear, and breath sounds are equal bilaterally.  Normal respiratory effort without pathologic use of accessory muscles. CARDIOVASCULAR: Heart is bradycardic in rate and regular in rhythm.   Well perfused.  GI: The abdomen is soft, nontender, and nondistended.  GU: Exam not repeated: Previously, the natal cleft is notable for a thickened ridge of scar tissue, creating an os inferiorly towards the anal  area that freely drains purulence with slight pressure.  There appears there may be some hair growth on the anal aspect.  And perhaps some foreign body/hair within this opening.  There is no erythema, no fluctuance, no exquisite tenderness present. MUSCULOSKELETAL:  Symmetrical muscle tone appreciated in all four extremities.    SKIN: Skin turgor is normal. No pathologic skin lesions appreciated.  NEUROLOGIC:  Motor and sensation appear grossly normal.  Cranial nerves are grossly without defect. PSYCH:  Alert and oriented to person, place and time. Affect is appropriate for situation.  Data Reviewed I have personally reviewed what is currently available of the patient's imaging, recent labs and medical records.   Labs:      No data to display              No data to display             Imaging: Radiological images reviewed:   Within last 24 hrs: No results found.  Assessment    Recurrent pilonidal cystic disease, currently with chronic/intermittent drainage. 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  Plans for healing by secondary intention. We discussed the prior failure of primary closure, and the diminished degree of recurrence with excision and healing by secondary intention.  I discussed postoperative wound care, the duration of wound care necessary to achieve complete healing and the lingering risk of recurrence with even this approach, compared with other approaches.  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  Face-to-face time spent with the patient and accompanying care providers(if present) was 20 minutes, with more than 50% of the time spent counseling, educating, and coordinating care of  the patient.    These notes generated with voice recognition software. I apologize for typographical errors.  [name] M.D., FACS 11/05/2022, 3:33 PM

## 2022-11-05 NOTE — Patient Instructions (Signed)
You have been seen today for a Pilonidal Cyst. You have requested for your Pilonidal cyst to be excised. This will be scheduled with Dr. Claudine Mouton at [facility].   You will need to arrange to be out of work for approximately 1-2 weeks and then have a family member change the dressing 1-2 times daily until this heals from the inside out.  If you have FMLA or disability paperwork that needs filled out you may drop this off at our office or this can be faxed to [phone].  Please see your (blue)pre-care sheet for information. Our surgery scheduler will call you to verify surgery date and to go over information.   Please call our office with any questions or concerns.    Pilonidal Cyst A pilonidal cyst is a fluid-filled sac. It forms beneath the skin near your tailbone, at the top of the crease of your buttocks. A pilonidal cyst that is not large or infected may not cause symptoms or problems. If the cyst becomes irritated or infected, it may fill with pus. This causes pain and swelling (pilonidal abscess). An infected cyst may need to be treated with medicine, drained, or removed. CAUSES The cause of a pilonidal cyst is not known. One cause may be a hair that grows into your skin (ingrown hair). RISK FACTORS Pilonidal cysts are more common in boys and men. Risk factors include: Having lots of hair near the crease of the buttocks. Being overweight. Having a pilonidal dimple. Wearing tight clothing. Not bathing or showering frequently. Sitting for long periods of time. SIGNS AND SYMPTOMS Signs and symptoms of a pilonidal cyst may include: Redness. Pain and tenderness. Warmth. Swelling. Pus. Fever. DIAGNOSIS Your health care provider may diagnose a pilonidal cyst based on your symptoms and a physical exam. The health care provider may do a blood test to check for infection. If your cyst is draining pus, your health care provider may take a sample of the drainage  to be tested at a laboratory. TREATMENT Surgery is the usual treatment for an infected pilonidal cyst. You may also have to take medicines before surgery. The type of surgery you have depends on the size and severity of the infected cyst. The different kinds of surgery include: Incision and drainage. This is a procedure to open and drain the cyst. Marsupialization. In this procedure, a large cyst or abscess may be opened and kept open by stitching the edges of the skin to the cyst walls. Cyst removal. This procedure involves opening the skin and removing all or part of the cyst. HOME CARE INSTRUCTIONS Follow all of your surgeon's instructions carefully if you had surgery. Take medicines only as directed by your health care provider. If you were prescribed an antibiotic medicine, finish it all even if you start to feel better. Keep the area around your pilonidal cyst clean and dry. Clean the area as directed by your health care provider. Pat the area dry with a clean towel. Do not rub it as this may cause bleeding. Remove hair from the area around the cyst as directed by your health care provider. Do not wear tight clothing or sit in one place for long periods of time. There are many different ways to close and cover an incision, including stitches, skin glue, and adhesive strips. Follow your health care provider's instructions on: Incision care. Bandage (dressing) changes and removal. Incision closure removal. SEEK MEDICAL CARE IF:  You have drainage, redness, swelling, or pain at the  site of the cyst. You have a fever.   This information is not intended to replace advice given to you by your health care provider. Make sure you discuss any questions you have with your health care provider.   Document Released: 05/17/2000 Document Revised: 06/10/2014 Document Reviewed: 10/07/2013 Elsevier Interactive Patient Education [employer]

## 2022-11-05 NOTE — H&P (View-Only) (Signed)
Patient ID: Todd Reese, male   DOB: 10/20/1995, 27 y.o.   MRN: [id number]  Chief Complaint: Recurrent pilonidal cyst  History of Present Illness This gentleman returns today ready to schedule surgery.  Currently feels that this time of year has the least competing with his wound care recovery.  Denies much change in his prior history, still having some intermittent drainage, very little pain, utilizing Epsom salt baths from time to time to keep the area clean. Todd Reese is a 27 y.o. male with a prior history of excision of pilonidal disease, with primary closure about 10 years ago.  Now with 3-year progressive development of recurrent disease.  Reports waxing and waning tenderness with intermittent drainage and associated discomfort.  Denies any fevers or chills, lancing procedures, or repeated operations.  Sporadically has minimal purulent drainage, without excruciating pain or disability.  Past Medical History Past Medical History:  Diagnosis Date   Allergy    Claritin.   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 [facility]. Vincent Paul MD. Follow up. Assessment: Improvement in periostitis tibiae. Physical therapy do some conditioning on his legs, but no running, and continue modalities. No running and we are going to probably return him in two weeks. Recheck 03/24/2012.    Pilonidal cyst without abscess 04/23/2012   Stress reaction 04/23/2012   Multiple family stressors in past six months including death of maternal grandmother, development of major depressive episode in mother.      Unspecified constipation       Past Surgical History:  Procedure Laterality Date   MASTOIDECTOMY  2004   Right   recircumscision  2008    No Known Allergies  Current Outpatient Medications  Medication Sig Dispense Refill   Multiple Vitamin (MULTIVITAMIN) capsule Take 1 capsule by mouth daily.     No current  facility-administered medications for this visit.    Family History Family History  Problem Relation Age of Onset   Depression Mother    Irritable bowel syndrome Mother    Depression Father    Heart disease Father    Hyperlipidemia Father    Sleep apnea Father    Colon polyps Father    ADD / ADHD Sister    Headache Sister    Scoliosis Sister       Social History Social History   Tobacco Use   Smoking status: Never    Passive exposure: Never   Smokeless tobacco: Never  Vaping Use   Vaping Use: Never used  Substance Use Topics   Alcohol use: No   Drug use: No        Review of Systems  Constitutional: Negative.   HENT: Negative.    Eyes: Negative.   Respiratory: Negative.    Cardiovascular: Negative.   Gastrointestinal: Negative.   Genitourinary: Negative.   Skin: Negative.   Neurological: Negative.   Psychiatric/Behavioral: Negative.       Physical Exam Blood pressure 98/63, pulse 60, temperature 98 F (36.7 C), height 5' 8" (1.727 m), weight 160 lb (72.6 kg), SpO2 97 %. Last Weight  Most recent update: 11/05/2022  3:26 PM    Weight  72.6 kg (160 lb)             CONSTITUTIONAL: Well developed, and nourished, appropriately responsive and aware without distress.   EYES: Sclera non-icteric.   EARS, NOSE, MOUTH AND THROAT:  The oropharynx is clear. Oral mucosa is pink   and moist.    Hearing is intact to voice.  NECK: Trachea is midline, and there is no jugular venous distension.  LYMPH NODES:  Lymph nodes in the neck are not appreciated. RESPIRATORY:  Lungs are clear, and breath sounds are equal bilaterally.  Normal respiratory effort without pathologic use of accessory muscles. CARDIOVASCULAR: Heart is bradycardic in rate and regular in rhythm.   Well perfused.  GI: The abdomen is soft, nontender, and nondistended.  GU: Exam not repeated: Previously, the natal cleft is notable for a thickened ridge of scar tissue, creating an os inferiorly towards the anal  area that freely drains purulence with slight pressure.  There appears there may be some hair growth on the anal aspect.  And perhaps some foreign body/hair within this opening.  There is no erythema, no fluctuance, no exquisite tenderness present. MUSCULOSKELETAL:  Symmetrical muscle tone appreciated in all four extremities.    SKIN: Skin turgor is normal. No pathologic skin lesions appreciated.  NEUROLOGIC:  Motor and sensation appear grossly normal.  Cranial nerves are grossly without defect. PSYCH:  Alert and oriented to person, place and time. Affect is appropriate for situation.  Data Reviewed I have personally reviewed what is currently available of the patient's imaging, recent labs and medical records.   Labs:      No data to display              No data to display             Imaging: Radiological images reviewed:   Within last 24 hrs: No results found.  Assessment    Recurrent pilonidal cystic disease, currently with chronic/intermittent drainage. 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  Plans for healing by secondary intention. We discussed the prior failure of primary closure, and the diminished degree of recurrence with excision and healing by secondary intention.  I discussed postoperative wound care, the duration of wound care necessary to achieve complete healing and the lingering risk of recurrence with even this approach, compared with other approaches.  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  Face-to-face time spent with the patient and accompanying care providers(if present) was 20 minutes, with more than 50% of the time spent counseling, educating, and coordinating care of  the patient.    These notes generated with voice recognition software. I apologize for typographical errors.  Tahj Njoku M.D., FACS 11/05/2022, 3:33 PM     

## 2022-11-06 ENCOUNTER — Telehealth: Payer: Self-pay | Admitting: Surgery

## 2022-11-06 NOTE — Telephone Encounter (Signed)
Tried to leave message, voice mailbox full.  If patient calls back, please inform him of the following regarding scheduled surgery with Dr. Claudine Mouton.   Pre-Admission date/time, and Surgery date at [facility].  Surgery Date: 11/18/22 Preadmission Testing Date: 11/12/22 (phone 1p-4p)  Also patient will need to call at [phone], between 1-3:00pm the day before surgery, to find out what time to arrive for surgery.

## 2022-11-06 NOTE — Telephone Encounter (Signed)
Patient calls back, he is now informed of all dates regarding his surgery.   

## 2022-11-12 ENCOUNTER — Other Ambulatory Visit: Payer: Self-pay

## 2022-11-12 ENCOUNTER — Encounter
Admission: RE | Admit: 2022-11-12 | Discharge: 2022-11-12 | Disposition: A | Discharge status: Home / Self Care | Payer: BC Managed Care – PPO | Source: Ambulatory Visit | Attending: Surgery | Admitting: Surgery

## 2022-11-12 NOTE — Patient Instructions (Signed)
Your procedure is scheduled on: Monday 11/18/22 To find out your arrival time, please call [phone] between 1PM - 3PM on:   Friday 11/15/22 Report to the Registration Desk on the 1st floor of the [facility]. Free Valet parking is available.  If your arrival time is 6:00 am, do not arrive before that time as the [facility] entrance doors do not open until 6:00 am.  REMEMBER: Instructions that are not followed completely may result in serious medical risk, up to and including death; or upon the discretion of your surgeon and anesthesiologist your surgery may need to be rescheduled.  Do not eat food or drink any liquids after midnight the night before surgery.  No gum chewing or hard candies.  One week prior to surgery: Stop Anti-inflammatories (NSAIDS) such as Advil, Aleve, Ibuprofen, Motrin, Naproxen, Naprosyn and Aspirin based products such as Excedrin, Goody's Powder, BC Powder. You may however, continue to take Tylenol if needed for pain up until the day of surgery.  Stop ANY OVER THE COUNTER supplements until after surgery.  Continue taking all prescription medications.   TAKE ONLY THESE MEDICATIONS THE MORNING OF SURGERY WITH A SIP OF WATER:  none  No Alcohol for 24 hours before or after surgery.  No Smoking including e-cigarettes for 24 hours before surgery.  No chewable tobacco products for at least 6 hours before surgery.  No nicotine patches on the day of surgery.  Do not use any "recreational" drugs for at least a week (preferably 2 weeks) before your surgery.  Please be advised that the combination of cocaine and anesthesia may have negative outcomes, up to and including death. If you test positive for cocaine, your surgery will be cancelled.  On the morning of surgery brush your teeth with toothpaste and water, you may rinse your mouth with mouthwash if you wish. Do not swallow any toothpaste or mouthwash.  Use CHG Soap or wipes as directed on instruction  sheet. You may purchase at your local drugstore or pick up at our office at [address], Pre Admit Testing OR Shower with liquid Dial soap.  Do not wear lotions, powders, or perfumes.   Do not shave body hair from the neck down 48 hours before surgery.  Wear comfortable clothing (specific to your surgery type) to the hospital.  Do not wear jewelry, make-up, hairpins, clips or nail polish.  Contact lenses, hearing aids and dentures may not be worn into surgery.  Do not bring valuables to the hospital. [facility] is not responsible for any missing/lost belongings or valuables.   Notify your doctor if there is any change in your medical condition (cold, fever, infection).  If you are being discharged the day of surgery, you will not be allowed to drive home. You will need a responsible individual to drive you home and stay with you for 24 hours after surgery.   If you are taking public transportation, you will need to have a responsible individual with you.  If you are being admitted to the hospital overnight, leave your suitcase in the car. After surgery it may be brought to your room.  In case of increased patient census, it may be necessary for you, the patient, to continue your postoperative care in the Same Day Surgery department.  After surgery, you can help prevent lung complications by doing breathing exercises.  Take deep breaths and cough every 1-2 hours. Your doctor may order a device called an Facilities manager  to help you take deep breaths. When coughing or sneezing, hold a pillow firmly against your incision with both hands. This is called "splinting." Doing this helps protect your incision. It also decreases belly discomfort.  Surgery Visitation Policy:  Patients undergoing a surgery or procedure may have two family members or support persons with them as long as the person is not COVID-19 positive or experiencing its symptoms.   Inpatient  Visitation:    Visiting hours are 7 a.m. to 8 p.m. Up to four visitors are allowed at one time in a patient room. The visitors may rotate out with other people during the day. One designated support person (adult) may remain overnight.  Please call the Pre-admissions Testing Dept. at [phone] if you have any questions about these instructions.     Preparing for Surgery with CHLORHEXIDINE GLUCONATE (CHG) Soap  Chlorhexidine Gluconate (CHG) Soap  o An antiseptic cleaner that kills germs and bonds with the skin to continue killing germs even after washing  o Used for showering the night before surgery and morning of surgery  Before surgery, you can play an important role by reducing the number of germs on your skin.  CHG (Chlorhexidine gluconate) soap is an antiseptic cleanser which kills germs and bonds with the skin to continue killing germs even after washing.  Please do not use if you have an allergy to CHG or antibacterial soaps. If your skin becomes reddened/irritated stop using the CHG.  1. Shower the NIGHT BEFORE SURGERY and the MORNING OF SURGERY with CHG soap.  2. If you choose to wash your hair, wash your hair first as usual with your normal shampoo.  3. After shampooing, rinse your hair and body thoroughly to remove the shampoo.  4. Use CHG as you would any other liquid soap. You can apply CHG directly to the skin and wash gently with a scrungie or a clean washcloth.  5. Apply the CHG soap to your body only from the neck down. Do not use on open wounds or open sores. Avoid contact with your eyes, ears, mouth, and genitals (private parts). Wash face and genitals (private parts) with your normal soap.  6. Wash thoroughly, paying special attention to the area where your surgery will be performed.  7. Thoroughly rinse your body with warm water.  8. Do not shower/wash with your normal soap after using and rinsing off the CHG soap.  9. Pat yourself dry with a clean  towel.  10. Wear clean pajamas to bed the night before surgery.  12. Place clean sheets on your bed the night of your first shower and do not sleep with pets.  13. Shower again with the CHG soap on the day of surgery prior to arriving at the hospital.  14. Do not apply any deodorants/lotions/powders.  15. Please wear clean clothes to the hospital.

## 2022-11-17 MED ORDER — GABAPENTIN 300 MG PO CAPS
300.0000 mg | ORAL_CAPSULE | ORAL | Status: AC
  Administered 2022-11-18: 300 mg via ORAL

## 2022-11-17 MED ORDER — ORAL CARE MOUTH RINSE: 15.0000 mL | Freq: Once | OROMUCOSAL | Status: AC

## 2022-11-17 MED ORDER — CHLORHEXIDINE GLUCONATE 0.12 % MT SOLN
15.0000 mL | Freq: Once | OROMUCOSAL | Status: AC
  Administered 2022-11-18: 15 mL via OROMUCOSAL

## 2022-11-17 MED ORDER — ACETAMINOPHEN 500 MG PO TABS
1000.0000 mg | ORAL_TABLET | ORAL | Status: AC
  Administered 2022-11-18: 1000 mg via ORAL

## 2022-11-17 MED ORDER — FAMOTIDINE 20 MG PO TABS
20.0000 mg | ORAL_TABLET | Freq: Once | ORAL | Status: AC
  Administered 2022-11-18: 20 mg via ORAL

## 2022-11-17 MED ORDER — CELECOXIB 200 MG PO CAPS
200.0000 mg | ORAL_CAPSULE | ORAL | Status: AC
  Administered 2022-11-18: 200 mg via ORAL

## 2022-11-17 MED ORDER — CHLORHEXIDINE GLUCONATE CLOTH 2 % EX PADS: 6.0000 | MEDICATED_PAD | Freq: Once | CUTANEOUS | Status: DC

## 2022-11-17 MED ORDER — CEFAZOLIN SODIUM-DEXTROSE 2-4 GM/100ML-% IV SOLN
2.0000 g | INTRAVENOUS | Status: AC
  Administered 2022-11-18: 2 g via INTRAVENOUS

## 2022-11-17 MED ORDER — LACTATED RINGERS IV SOLN: INTRAVENOUS | Status: DC

## 2022-11-17 MED ORDER — CHLORHEXIDINE GLUCONATE CLOTH 2 % EX PADS
6.0000 | MEDICATED_PAD | Freq: Once | CUTANEOUS | Status: AC
  Administered 2022-11-18: 6 via TOPICAL

## 2022-11-17 MED ORDER — BUPIVACAINE LIPOSOME 1.3 % IJ SUSP: 20.0000 mL | Freq: Once | INTRAMUSCULAR | Status: DC

## 2022-11-18 ENCOUNTER — Other Ambulatory Visit: Payer: Self-pay

## 2022-11-18 ENCOUNTER — Encounter: Payer: Self-pay | Admitting: Surgery

## 2022-11-18 ENCOUNTER — Encounter
Admission: RE | Disposition: A | Discharge status: Home / Self Care | Payer: Self-pay | Source: Home / Self Care | Attending: Surgery

## 2022-11-18 ENCOUNTER — Ambulatory Visit: Payer: BC Managed Care – PPO | Admitting: Certified Registered"

## 2022-11-18 ENCOUNTER — Ambulatory Visit
Admission: RE | Admit: 2022-11-18 | Discharge: 2022-11-18 | Disposition: A | Discharge status: Home / Self Care | Payer: BC Managed Care – PPO | Attending: Surgery | Admitting: Surgery

## 2022-11-18 ENCOUNTER — Ambulatory Visit: Payer: BC Managed Care – PPO | Admitting: Urgent Care

## 2022-11-18 DIAGNOSIS — L0591 Pilonidal cyst without abscess: Secondary | ICD-10-CM

## 2022-11-18 HISTORY — PX: PILONIDAL CYST EXCISION: SHX744

## 2022-11-18 SURGERY — EXCISION, PILONIDAL CYST, EXTENSIVE
Anesthesia: General

## 2022-11-18 MED ORDER — SUCCINYLCHOLINE CHLORIDE 200 MG/10ML IV SOSY
PREFILLED_SYRINGE | INTRAVENOUS | Status: DC | PRN
  Administered 2022-11-18: 120 mg via INTRAVENOUS

## 2022-11-18 MED ORDER — FENTANYL CITRATE (PF) 100 MCG/2ML IJ SOLN
INTRAMUSCULAR | Status: DC | PRN
  Administered 2022-11-18 (×2): 50 ug via INTRAVENOUS

## 2022-11-18 MED ORDER — SUCCINYLCHOLINE CHLORIDE 200 MG/10ML IV SOSY
PREFILLED_SYRINGE | INTRAVENOUS | Status: AC
  Filled 2022-11-18: qty 10

## 2022-11-18 MED ORDER — CHLORHEXIDINE GLUCONATE 0.12 % MT SOLN
OROMUCOSAL | Status: AC
  Filled 2022-11-18: qty 15

## 2022-11-18 MED ORDER — FAMOTIDINE 20 MG PO TABS
ORAL_TABLET | ORAL | Status: AC
  Filled 2022-11-18: qty 1

## 2022-11-18 MED ORDER — EPHEDRINE 5 MG/ML INJ
INTRAVENOUS | Status: AC
  Filled 2022-11-18: qty 5

## 2022-11-18 MED ORDER — LIDOCAINE HCL (PF) 2 % IJ SOLN
INTRAMUSCULAR | Status: AC
  Filled 2022-11-18: qty 5

## 2022-11-18 MED ORDER — HYDROCODONE-ACETAMINOPHEN 5-325 MG PO TABS
1.0000 | ORAL_TABLET | Freq: Four times a day (QID) | ORAL | 0 refills | Status: DC | PRN
Start: 2022-11-18 — End: 2022-12-03

## 2022-11-18 MED ORDER — PROPOFOL 10 MG/ML IV BOLUS
INTRAVENOUS | Status: DC | PRN
  Administered 2022-11-18: 200 mg via INTRAVENOUS

## 2022-11-18 MED ORDER — METHYLENE BLUE 1 % INJ SOLN
INTRAVENOUS | Status: DC | PRN
  Administered 2022-11-18: 1 mL

## 2022-11-18 MED ORDER — ONDANSETRON HCL 4 MG/2ML IJ SOLN
INTRAMUSCULAR | Status: DC | PRN
  Administered 2022-11-18: 4 mg via INTRAVENOUS

## 2022-11-18 MED ORDER — DROPERIDOL 2.5 MG/ML IJ SOLN: 0.6250 mg | Freq: Once | INTRAMUSCULAR | Status: DC | PRN

## 2022-11-18 MED ORDER — EPINEPHRINE PF 1 MG/ML IJ SOLN
INTRAMUSCULAR | Status: AC
  Filled 2022-11-18: qty 1

## 2022-11-18 MED ORDER — FENTANYL CITRATE (PF) 100 MCG/2ML IJ SOLN
INTRAMUSCULAR | Status: AC
  Filled 2022-11-18: qty 2

## 2022-11-18 MED ORDER — FENTANYL CITRATE (PF) 100 MCG/2ML IJ SOLN: 25.0000 ug | INTRAMUSCULAR | Status: DC | PRN

## 2022-11-18 MED ORDER — IBUPROFEN 800 MG PO TABS: 800.0000 mg | ORAL_TABLET | Freq: Three times a day (TID) | ORAL | 0 refills | Status: DC | PRN

## 2022-11-18 MED ORDER — MIDAZOLAM HCL 2 MG/2ML IJ SOLN
INTRAMUSCULAR | Status: AC
  Filled 2022-11-18: qty 2

## 2022-11-18 MED ORDER — BUPIVACAINE HCL (PF) 0.25 % IJ SOLN
INTRAMUSCULAR | Status: AC
  Filled 2022-11-18: qty 30

## 2022-11-18 MED ORDER — HYDROGEN PEROXIDE 3 % EX SOLN
CUTANEOUS | Status: DC | PRN
  Administered 2022-11-18: 1

## 2022-11-18 MED ORDER — OXYCODONE HCL 5 MG PO TABS: 5.0000 mg | ORAL_TABLET | Freq: Once | ORAL | Status: DC | PRN

## 2022-11-18 MED ORDER — MIDAZOLAM HCL 2 MG/2ML IJ SOLN
INTRAMUSCULAR | Status: DC | PRN
  Administered 2022-11-18: 2 mg via INTRAVENOUS

## 2022-11-18 MED ORDER — CELECOXIB 200 MG PO CAPS
ORAL_CAPSULE | ORAL | Status: AC
  Filled 2022-11-18: qty 1

## 2022-11-18 MED ORDER — CEFAZOLIN SODIUM-DEXTROSE 2-4 GM/100ML-% IV SOLN
INTRAVENOUS | Status: AC
  Filled 2022-11-18: qty 100

## 2022-11-18 MED ORDER — METHYLENE BLUE 1 % INJ SOLN
INTRAVENOUS | Status: AC
  Filled 2022-11-18: qty 10

## 2022-11-18 MED ORDER — DEXAMETHASONE SODIUM PHOSPHATE 10 MG/ML IJ SOLN
INTRAMUSCULAR | Status: AC
  Filled 2022-11-18: qty 1

## 2022-11-18 MED ORDER — PROMETHAZINE HCL 25 MG/ML IJ SOLN: 6.2500 mg | INTRAMUSCULAR | Status: DC | PRN

## 2022-11-18 MED ORDER — 0.9 % SODIUM CHLORIDE (POUR BTL) OPTIME
TOPICAL | Status: DC | PRN
  Administered 2022-11-18: 500 mL

## 2022-11-18 MED ORDER — BUPIVACAINE-EPINEPHRINE 0.25% -1:200000 IJ SOLN
INTRAMUSCULAR | Status: DC | PRN
  Administered 2022-11-18: 30 mL

## 2022-11-18 MED ORDER — DEXAMETHASONE SODIUM PHOSPHATE 10 MG/ML IJ SOLN
INTRAMUSCULAR | Status: DC | PRN
  Administered 2022-11-18: 10 mg via INTRAVENOUS

## 2022-11-18 MED ORDER — LIDOCAINE HCL (CARDIAC) PF 100 MG/5ML IV SOSY
PREFILLED_SYRINGE | INTRAVENOUS | Status: DC | PRN
  Administered 2022-11-18: 100 mg via INTRAVENOUS

## 2022-11-18 MED ORDER — PROPOFOL 10 MG/ML IV BOLUS
INTRAVENOUS | Status: AC
  Filled 2022-11-18: qty 20

## 2022-11-18 MED ORDER — PHENYLEPHRINE 80 MCG/ML (10ML) SYRINGE FOR IV PUSH (FOR BLOOD PRESSURE SUPPORT)
PREFILLED_SYRINGE | INTRAVENOUS | Status: AC
  Filled 2022-11-18: qty 10

## 2022-11-18 MED ORDER — PHENYLEPHRINE HCL (PRESSORS) 10 MG/ML IV SOLN
INTRAVENOUS | Status: DC | PRN
  Administered 2022-11-18 (×2): 80 ug via INTRAVENOUS

## 2022-11-18 MED ORDER — ACETAMINOPHEN 10 MG/ML IV SOLN: 1000.0000 mg | Freq: Once | INTRAVENOUS | Status: DC | PRN

## 2022-11-18 MED ORDER — ACETAMINOPHEN 500 MG PO TABS
ORAL_TABLET | ORAL | Status: AC
  Filled 2022-11-18: qty 2

## 2022-11-18 MED ORDER — ONDANSETRON HCL 4 MG/2ML IJ SOLN
INTRAMUSCULAR | Status: AC
  Filled 2022-11-18: qty 2

## 2022-11-18 MED ORDER — OXYCODONE HCL 5 MG/5ML PO SOLN: 5.0000 mg | Freq: Once | ORAL | Status: DC | PRN

## 2022-11-18 MED ORDER — GABAPENTIN 300 MG PO CAPS
ORAL_CAPSULE | ORAL | Status: AC
  Filled 2022-11-18: qty 1

## 2022-11-18 SURGICAL SUPPLY — 39 items
BLADE CLIPPER SURG (BLADE) IMPLANT
BLADE SURG 15 STRL LF DISP TIS (BLADE) ×1 IMPLANT
BLADE SURG 15 STRL SS (BLADE) ×1
BRIEF MESH DISP 2XL (UNDERPADS AND DIAPERS) ×1 IMPLANT
CLIPPER SURGICAL HAIR RECHARGE (MISCELLANEOUS)
DRAPE LAPAROTOMY 100X77 ABD (DRAPES) ×1 IMPLANT
DRSG GAUZE FLUFF 36X18 (GAUZE/BANDAGES/DRESSINGS) ×1 IMPLANT
ELECT CAUTERY BLADE TIP 2.5 (TIP) ×1
ELECT REM PT RETURN 9FT ADLT (ELECTROSURGICAL) ×1
ELECTRODE CAUTERY BLDE TIP 2.5 (TIP) ×1 IMPLANT
ELECTRODE REM PT RTRN 9FT ADLT (ELECTROSURGICAL) ×1 IMPLANT
GAUZE 4X4 16PLY ~~LOC~~+RFID DBL (SPONGE) ×1 IMPLANT
GAUZE PACKING 0.25INX5YD STRL (GAUZE/BANDAGES/DRESSINGS) ×1 IMPLANT
GAUZE SPONGE 4X4 12PLY STRL (GAUZE/BANDAGES/DRESSINGS) ×1 IMPLANT
GAUZE VASELINE 3X9 (GAUZE/BANDAGES/DRESSINGS) ×1 IMPLANT
GLOVE ORTHO TXT STRL SZ7.5 (GLOVE) ×2 IMPLANT
GOWN STRL REUS W/ TWL LRG LVL3 (GOWN DISPOSABLE) ×1 IMPLANT
GOWN STRL REUS W/ TWL XL LVL3 (GOWN DISPOSABLE) ×1 IMPLANT
GOWN STRL REUS W/TWL LRG LVL3 (GOWN DISPOSABLE) ×1
GOWN STRL REUS W/TWL XL LVL3 (GOWN DISPOSABLE) ×1
KIT TURNOVER KIT A (KITS) ×1 IMPLANT
MANIFOLD NEPTUNE II (INSTRUMENTS) ×1 IMPLANT
NDL HYPO 22X1.5 SAFETY MO (MISCELLANEOUS) ×2 IMPLANT
NEEDLE HYPO 22X1.5 SAFETY MO (MISCELLANEOUS) ×2 IMPLANT
NS IRRIG 500ML POUR BTL (IV SOLUTION) IMPLANT
PACK BASIN MINOR ARMC (MISCELLANEOUS) ×1 IMPLANT
SOL PREP PVP 2OZ (MISCELLANEOUS) ×1
SOLUTION PREP PVP 2OZ (MISCELLANEOUS) ×1 IMPLANT
SURGICAL HAIR CLIPPER RECHARGE (MISCELLANEOUS) IMPLANT
SURGILUBE 2OZ TUBE FLIPTOP (MISCELLANEOUS) IMPLANT
SUT CHROMIC 3 0 SH 27 (SUTURE) IMPLANT
SUT PROLENE 2 0 SH DA (SUTURE) IMPLANT
SUT PROLENE 3 0 SH DA (SUTURE) IMPLANT
SWABSTK COMLB BENZOIN TINCTURE (MISCELLANEOUS) ×1 IMPLANT
SYR 10ML LL (SYRINGE) ×1 IMPLANT
SYR 20ML LL LF (SYRINGE) ×2 IMPLANT
TAPE CLOTH 3X10 WHT NS LF (GAUZE/BANDAGES/DRESSINGS) ×1 IMPLANT
TRAP FLUID SMOKE EVACUATOR (MISCELLANEOUS) ×1 IMPLANT
WATER STERILE IRR 500ML POUR (IV SOLUTION) ×1 IMPLANT

## 2022-11-18 NOTE — Op Note (Signed)
Excision pilonidal cyst extensive.   Pre-operative Diagnosis: Pilonidal cyst, complicated with prior abscesses.   Post-operative Diagnosis: same.    Surgeon: [name], M.D., FACS  Anesthesia: GETA   Findings: Isolated abscess cavity with tract to peri-anal/low pilonidal sinus tract.   Estimated Blood Loss: 5 mL         Specimens: Skin and pilonidal cyst roof/sinus.           Complications: none              Procedure Details  The patient was seen again in the Holding Room. The benefits, complications, treatment options, and expected outcomes were discussed with the patient. The risks of bleeding, infection, recurrence of symptoms, failure to resolve symptoms, unanticipated injury, prosthetic placement, prosthetic infection, any of which could require further surgery were reviewed with the patient. The likelihood of improving the patient's symptoms with return to their baseline status is expected.  The patient and/or family concurred with the proposed plan, giving informed consent.  The patient was taken to Operating Room, identified and the procedure verified.    Prior to the induction of general anesthesia, antibiotic prophylaxis was administered. VTE prophylaxis was in place. GETA was then administered and tolerated well. After the induction, the patient was positioned in the Prone position and the natal cleft/buttocks/perianal area prepped with Betadine and draped in the sterile fashion.  A Time Out was held and the above information confirmed.  I was able to cannulate a downward facing midline pit that seemed to have a bit of a cephalad covering hood over it.  I instilled a mixture of methylene blue with hydroperoxide into that space.  It drained spontaneously through a small opening just to the left of midline very near the midline pit.  There were no other midline pits, there was some irregularity in the thickness of the subcutaneous tissues cephalad from this point but there is  no evidence of any conjunctions or communications to this main pit.  Utilizing a probe I proceeded with excising and unroofing the hooded area over the pit and the roof over the prior abscess cavity and the tract that connected the 2.  Once this was completed I ensured adequate debridement of the contents of the abscess cavity, probed to assess the tunneling or communication with any other areas in this region and found none.  We then confirmed adequate hemostasis, irrigated the wound and then instilled/infiltrated additional local anesthetic.  We then packed the wound with half-inch wide packing strip wet with local anesthetic, covered it with Vaseline gauze and applied a dressing.  Procedure was well-tolerated.      [name] M.D., [facility] 11/18/2022 4:22 PM

## 2022-11-18 NOTE — Anesthesia Procedure Notes (Signed)
Procedure Name: Intubation Date/Time: 11/18/2022 3:37 PM  Performed by: Morene Crocker, CRNAPre-anesthesia Checklist: Patient identified, Patient being monitored, Timeout performed, Emergency Drugs available and Suction available Patient Re-evaluated:Patient Re-evaluated prior to induction Oxygen Delivery Method: Circle system utilized Preoxygenation: Pre-oxygenation with 100% oxygen Induction Type: IV induction Ventilation: Mask ventilation without difficulty Laryngoscope Size: 3 and McGraph Grade View: Grade I Tube type: Oral Tube size: 7.0 mm Number of attempts: 1 Airway Equipment and Method: Stylet Placement Confirmation: ETT inserted through vocal cords under direct vision, positive ETCO2 and breath sounds checked- equal and bilateral Secured at: 22 cm Tube secured with: Tape Dental Injury: Teeth and Oropharynx as per pre-operative assessment  Comments: Smooth, atraumatic intubation. No complications noted.

## 2022-11-18 NOTE — Discharge Instructions (Signed)

## 2022-11-18 NOTE — Interval H&P Note (Signed)
History and Physical Interval Note:  11/18/2022 1:27 PM  Todd Reese  has presented today for surgery, with the diagnosis of pilonidal cyst.  The various methods of treatment have been discussed with the patient and family. After consideration of risks, benefits and other options for treatment, the patient has consented to  Procedure(s): CYST EXCISION PILONIDAL EXTENSIVE (N/A) as a surgical intervention.  The patient's history has been reviewed, patient examined, no change in status, stable for surgery.  I have reviewed the patient's chart and labs.  Questions were answered to the patient's satisfaction.     [name]

## 2022-11-18 NOTE — Anesthesia Preprocedure Evaluation (Signed)
Anesthesia Evaluation  Patient identified by MRN, date of birth, ID band Patient awake    Reviewed: Allergy & Precautions, H&P , NPO status , Patient's Chart, lab work & pertinent test results, reviewed documented beta blocker date and time   Airway Mallampati: II  TM Distance: >3 FB Neck ROM: full    Dental  (+) Teeth Intact   Pulmonary neg pulmonary ROS   Pulmonary exam normal        Cardiovascular Exercise Tolerance: Good negative cardio ROS Normal cardiovascular exam Rhythm:regular Rate:Normal     Neuro/Psych   Anxiety     negative neurological ROS  negative psych ROS   GI/Hepatic negative GI ROS, Neg liver ROS,,,  Endo/Other  negative endocrine ROS    Renal/GU negative Renal ROS  negative genitourinary   Musculoskeletal   Abdominal   Peds  Hematology negative hematology ROS (+)   Anesthesia Other Findings Past Medical History: No date: Allergy     Comment:  Claritin. 07/05/2011: Concussion No date: Other acne 08/04/2012: periostitis tibiae     Comment:  03/12/2012. [facility]. Doristine Section MD. Follow up. Assessment:               Improvement in periostitis tibiae. Physical therapy do               some conditioning on his legs, but no running, and               continue modalities. No running and we are going to               probably return him in two weeks. Recheck 03/24/2012.  04/23/2012: Pilonidal cyst without abscess 04/23/2012: Stress reaction     Comment:  Multiple family stressors in past six months including               death of maternal grandmother, development of major               depressive episode in mother.    No date: Unspecified constipation Past Surgical History: 2004: MASTOIDECTOMY     Comment:  Right No date: PILONIDAL CYST EXCISION 2008: recircumscision   Reproductive/Obstetrics negative OB ROS                              Anesthesia Physical Anesthesia Plan  ASA: 2  Anesthesia Plan: General ETT   Post-op Pain Management:    Induction:   PONV Risk Score and Plan:   Airway Management Planned:   Additional Equipment:   Intra-op Plan:   Post-operative Plan:   Informed Consent: I have reviewed the patients History and Physical, chart, labs and discussed the procedure including the risks, benefits and alternatives for the proposed anesthesia with the patient or authorized representative who has indicated his/her understanding and acceptance.     Dental Advisory Given  Plan Discussed with: CRNA  Anesthesia Plan Comments:        Anesthesia Quick Evaluation

## 2022-11-18 NOTE — Transfer of Care (Signed)
Immediate Anesthesia Transfer of Care Note  Patient: Todd Reese  Procedure(s) Performed: CYST EXCISION PILONIDAL EXTENSIVE  Patient Location: PACU  Anesthesia Type:General  Level of Consciousness: drowsy  Airway & Oxygen Therapy: Patient Spontanous Breathing and Patient connected to face mask oxygen  Post-op Assessment: Report given to RN and Post -op Vital signs reviewed and stable  Post vital signs: Reviewed and stable  Last Vitals:  Vitals Value Taken Time  BP 115/65 11/18/22 1632  Temp 36.1 1632  Pulse 83 11/18/22 1637  Resp 12 11/18/22 1637  SpO2 100 % 11/18/22 1637  Vitals shown include unvalidated device data.  Last Pain:  Vitals:   11/18/22 1320  TempSrc: Temporal  PainSc: 0-No pain         Complications: No notable events documented.

## 2022-11-19 ENCOUNTER — Encounter: Payer: Self-pay | Admitting: Surgery

## 2022-11-19 NOTE — Anesthesia Postprocedure Evaluation (Signed)
Anesthesia Post Note  Patient: Todd Reese  Procedure(s) Performed: CYST EXCISION PILONIDAL EXTENSIVE  Patient location during evaluation: PACU Anesthesia Type: General Level of consciousness: awake and alert Pain management: pain level controlled Vital Signs Assessment: post-procedure vital signs reviewed and stable Respiratory status: spontaneous breathing, nonlabored ventilation, respiratory function stable and patient connected to nasal cannula oxygen Cardiovascular status: blood pressure returned to baseline and stable Postop Assessment: no apparent nausea or vomiting Anesthetic complications: no   No notable events documented.   Last Vitals:  Vitals:   11/18/22 1700 11/18/22 1709  BP: (!) 115/57 107/85  Pulse: 68 64  Resp: 13 18  Temp: (!) 36.2 C   SpO2: 100% 100%    Last Pain:  Vitals:   11/18/22 1709  TempSrc:   PainSc: 0-No pain                 Yevette Edwards

## 2022-11-21 ENCOUNTER — Encounter: Payer: Self-pay | Admitting: Surgery

## 2022-12-03 ENCOUNTER — Ambulatory Visit (INDEPENDENT_AMBULATORY_CARE_PROVIDER_SITE_OTHER): Payer: BC Managed Care – PPO | Admitting: Physician Assistant

## 2022-12-03 ENCOUNTER — Encounter: Payer: Self-pay | Admitting: Physician Assistant

## 2022-12-03 VITALS — BP 136/81 | HR 56 | Temp 98.2°F | Ht 68.0 in | Wt 156.8 lb

## 2022-12-03 DIAGNOSIS — Z09 Encounter for follow-up examination after completed treatment for conditions other than malignant neoplasm: Secondary | ICD-10-CM

## 2022-12-03 DIAGNOSIS — Z9889 Other specified postprocedural states: Secondary | ICD-10-CM

## 2022-12-03 DIAGNOSIS — L0591 Pilonidal cyst without abscess: Secondary | ICD-10-CM

## 2022-12-03 NOTE — Progress Notes (Signed)
[facility] POST-OP OFFICE VISIT  12/03/2022  HPI: Todd Reese is a 27 y.o. male 15 days s/p excision of pilonidal cyst with prior abscess with Dr Claudine Mouton   He is overall doing well No significant complaints of pain but trying to limit sitting on this No fever, chills Parents are assisting with dressing changes No other complaints   Vital signs: BP 136/81   Pulse (!) 56   Temp 98.2 F (36.8 C) (Oral)   Ht 5\' 8"  (1.727 m)   Wt 156 lb 12.8 oz (71.1 kg)   SpO2 97%   BMI 23.84 kg/m    Physical Exam: Constitutional: Well appearing male, NAD Skin: Incision to pilonidal cleft, this is healing well, wound bed is 100% healthy granulation tissue, minimal serous drainage on previous dressing   Assessment/Plan: This is a 27 y.o. male 15 days s/p excision of pilonidal cyst with prior abscess with Dr Claudine Mouton    - Pain control prn  - Reviewed wound care recommendation; continue daily dressing changes + packing, okay to remove to shower  - Reviewed surgical pathology; Pilonidal Disease   - He can follow up in ~2 weeks for wound check can follow up on as needed basis; He understands to call with questions/concerns in the interim  -- Lynden Oxford, PA-C [facility] 12/03/2022, 2:19 PM M-F: 7am - 4pm

## 2022-12-03 NOTE — Patient Instructions (Signed)
Pilonidal Cyst Removal, Care After The following information offers guidance on how to care for yourself after your procedure. Your health care provider may also give you more specific instructions. If you have problems or questions, contact your health care provider. What can I expect after the procedure? After the procedure, it is common to have: Pain. Redness. Some swelling. Some fluid or blood coming from your incision (drainage). You may have more drainage if you have an open incision. Follow these instructions at home: Medicines Take over-the-counter and prescription medicines only as told by your health care provider. If you were prescribed antibiotics, take them as told by your health care provider. Do not stop using the antibiotic even if you start to feel better. Ask your health care provider if the medicine prescribed to you: Requires you to avoid driving or using machinery. Can cause constipation. You may need to take these actions to prevent or treat constipation: Drink enough fluid to keep your urine pale yellow. Take over-the-counter or prescription medicines. Eat foods that are high in fiber, such as beans, whole grains, and fresh fruits and vegetables. Limit foods that are high in fat and processed sugars, such as fried or sweet foods. Incision care  You may need to have a caregiver help you with wound care and dressing changes. Follow instructions from your health care provider about how to take care of your incision. Make sure you: Wash your hands with soap and water for at least 20 seconds before and after you change your bandage (dressing). If soap and water are not available, use hand sanitizer. Change your dressing as told by your health care provider. Leave stitches (sutures), skin glue, or adhesive strips in place. These skin closures may need to stay in place for 2 weeks or longer. If adhesive strip edges start to loosen and curl up, you may trim the loose edges. Do  not remove adhesive strips completely unless your health care provider tells you to do that. Check your incision area every day for signs of infection. If it is hard to see the area, have someone check for you. Check for: More redness, swelling, or pain. More fluid or blood. Warmth. Pus or a bad smell. Managing pain and swelling If directed, put ice on the affected area. To do this: Put ice in a plastic bag. Place a towel between your skin and the bag. Leave the ice on for 20 minutes, 2-3 times a day. If your skin turns bright red, remove the ice right away to prevent skin damage. The risk of skin damage is higher if you cannot feel pain, heat, or cold. Activity Do not do activities that cause pain or irritate the incision area. These may include bike riding, running, sit ups, or anything that involves a twisting motion. Rest as told by your health care provider. Do not sit for a long time without moving. Get up to take short walks every 1-2 hours. This will improve blood flow and breathing. Ask for help if you feel weak or unsteady. Return to your normal activities as told by your health care provider. Ask your health care provider what activities are safe for you. General instructions Do not use any products that contain nicotine or tobacco. These products include cigarettes, chewing tobacco, and vaping devices, such as e-cigarettes. These can delay healing after surgery. If you need help quitting, ask your health care provider. Do not take baths, swim, or use a hot tub until your health care provider approves.  Ask your health care provider if you may take showers. You may only be allowed to take sponge baths. Keep all follow-up visits. This is important to monitor healing. If you had a procedure with wound packing, your packing may be changed or removed at follow-up visits. Contact a health care provider if: You have pain that does not get better with medicine. You have any of these signs  of infection: More redness, swelling, or pain around your incision. More fluid or blood coming from your incision. Warmth coming from your incision. Pus or a bad smell coming from your incision. A fever. Get help right away if: You have severe pain in your abdomen. You have sudden chest pain and shortness of breath. You cough up blood. You faint or lose consciousness. These symptoms may be an emergency. Get help right away. Call 911. Do not wait to see if the symptoms will go away. Do not drive yourself to the hospital. Summary It is common to have some pain and drainage after your procedure. You may have more drainage if you have an open incision. You may need to have a caregiver help you with wound care and dressing changes. Do not do activities that cause pain or irritate the incision area. Contact your health care provider if you have pain that does not get better with medicine or if you have any signs of infection. This information is not intended to replace advice given to you by your health care provider. Make sure you discuss any questions you have with your health care provider. Document Revised: 08/24/2021 Document Reviewed: 08/24/2021 Elsevier Patient Education  2024 [employer].

## 2022-12-17 ENCOUNTER — Encounter: Payer: Self-pay | Admitting: Physician Assistant

## 2022-12-17 ENCOUNTER — Ambulatory Visit (INDEPENDENT_AMBULATORY_CARE_PROVIDER_SITE_OTHER): Payer: BC Managed Care – PPO | Admitting: Physician Assistant

## 2022-12-17 VITALS — BP 109/71 | HR 73 | Temp 98.0°F | Ht 68.0 in | Wt 155.0 lb

## 2022-12-17 DIAGNOSIS — Z09 Encounter for follow-up examination after completed treatment for conditions other than malignant neoplasm: Secondary | ICD-10-CM

## 2022-12-17 DIAGNOSIS — L0591 Pilonidal cyst without abscess: Secondary | ICD-10-CM

## 2022-12-17 NOTE — Progress Notes (Signed)
[facility] POST-OP OFFICE VISIT  12/17/2022  HPI: Todd Reese is a 27 y.o. male 29 days s/p excision of pilonidal cyst with prior abscess with Dr Claudine Mouton   He continues to do well No issues with wound care at home No fever, chills Otherwise doing well   Vital signs: BP 109/71   Pulse 73   Temp 98 F (36.7 C)   Ht 5\' 8"  (1.727 m)   Wt 155 lb (70.3 kg)   SpO2 98%   BMI 23.57 kg/m    Physical Exam: Constitutional: Well appearing male, NAD Skin: Pilonidal excision is now approximately 2 x 2 cm without any depth, wound bed is 100% granulation tissue, no drainage   Assessment/Plan: This is a 27 y.o. male 29 days s/p excision of pilonidal cyst with prior abscess with Dr Claudine Mouton     - We can transition to superficial dressings now given lack of depth in wound bed; okay to remove to shower, no submerging wound, replace dressing daily and PRN  - I will see him again in 2-3 weeks for recheck; He understands to call with questions/concerns in the interim.  -- Lynden Oxford, PA-C [facility] 12/17/2022, 2:45 PM M-F: 7am - 4pm

## 2022-12-17 NOTE — Patient Instructions (Addendum)
Place a new dressing everyday until the area closes fully.  Follow up in 3 weeks.   Please call and ask to speak with a nurse if you develop questions or concerns.

## 2023-01-02 ENCOUNTER — Encounter: Payer: Self-pay | Admitting: Physician Assistant

## 2023-01-02 ENCOUNTER — Ambulatory Visit (INDEPENDENT_AMBULATORY_CARE_PROVIDER_SITE_OTHER): Payer: BC Managed Care – PPO | Admitting: Physician Assistant

## 2023-01-02 VITALS — BP 114/70 | HR 51 | Temp 98.0°F | Ht 68.0 in | Wt 154.0 lb

## 2023-01-02 DIAGNOSIS — L0591 Pilonidal cyst without abscess: Secondary | ICD-10-CM

## 2023-01-02 DIAGNOSIS — Z09 Encounter for follow-up examination after completed treatment for conditions other than malignant neoplasm: Secondary | ICD-10-CM

## 2023-01-02 NOTE — Progress Notes (Signed)
[facility] POST-OP OFFICE VISIT  01/02/2023  HPI: Todd Reese is a 27 y.o. male ~6 weeks s/p excision of pilonidal cyst with prior abscess with Dr Kayren Eaves to do well No depth to wound No fever, chills No issues with dressing changes  Vital signs: BP 114/70   Pulse (!) 51   Temp 98 F (36.7 C)   Ht 5\' 8"  (1.727 m)   Wt 154 lb (69.9 kg)   SpO2 98%   BMI 23.42 kg/m    Physical Exam: Constitutional: Well appearing male, NAD Skin: Chaperone present, Pilonidal excision is now approximately 1 x 1 cm without any depth, wound bed is 100% granulation tissue, no drainage   Assessment/Plan: This is a 27 y.o. male ~6 weeks s/p excision of pilonidal cyst with prior abscess with Dr Claudine Mouton    - Continue superficial dressing changes daily and as needed  - I will see him again on 08/20 for wound check; He understands to call with questions/concerns  -- Todd Oxford, PA-C [facility] 01/02/2023, 1:57 PM M-F: 7am - 4pm

## 2023-01-21 ENCOUNTER — Ambulatory Visit: Payer: BC Managed Care – PPO | Admitting: Physician Assistant

## 2023-01-21 ENCOUNTER — Encounter: Payer: Self-pay | Admitting: Physician Assistant

## 2023-01-21 VITALS — BP 113/70 | HR 59 | Temp 98.3°F | Ht 68.0 in | Wt 155.0 lb

## 2023-01-21 DIAGNOSIS — Z09 Encounter for follow-up examination after completed treatment for conditions other than malignant neoplasm: Secondary | ICD-10-CM

## 2023-01-21 DIAGNOSIS — L0591 Pilonidal cyst without abscess: Secondary | ICD-10-CM

## 2023-01-21 NOTE — Progress Notes (Signed)
[facility] POST-OP OFFICE VISIT  01/21/2023  HPI: Todd Reese is a 27 y.o. male ~8 weeks s/p excision of pilonidal cyst with prior abscess with Dr Claudine Mouton   He is doing well No issues Wound closed  No fever, chills  Vital signs: BP 113/70   Pulse (!) 59   Temp 98.3 F (36.8 C) (Oral)   Ht 5\' 8"  (1.727 m)   Wt 155 lb (70.3 kg)   SpO2 99%   BMI 23.57 kg/m    Physical Exam: Constitutional: Well appearing male, NAD Skin: Pilonidal excision site is completely healed, no erythema or drainage  Assessment/Plan: This is a 27 y.o. male ~8 weeks s/p excision of pilonidal cyst with prior abscess with Dr Claudine Mouton   - Nothing further needed  - He can follow up on as needed basis; He understands to call with questions/concerns  -- Lynden Oxford, PA-C [facility] 01/21/2023, 2:00 PM M-F: 7am - 4pm

## 2023-01-21 NOTE — Patient Instructions (Signed)
Pilonidal Cyst Removal, Care After The following information offers guidance on how to care for yourself after your procedure. Your health care provider may also give you more specific instructions. If you have problems or questions, contact your health care provider. What can I expect after the procedure? After the procedure, it is common to have: Pain. Redness. Some swelling. Some fluid or blood coming from your incision (drainage). You may have more drainage if you have an open incision. Follow these instructions at home: Medicines Take over-the-counter and prescription medicines only as told by your health care provider. If you were prescribed antibiotics, take them as told by your health care provider. Do not stop using the antibiotic even if you start to feel better. Ask your health care provider if the medicine prescribed to you: Requires you to avoid driving or using machinery. Can cause constipation. You may need to take these actions to prevent or treat constipation: Drink enough fluid to keep your urine pale yellow. Take over-the-counter or prescription medicines. Eat foods that are high in fiber, such as beans, whole grains, and fresh fruits and vegetables. Limit foods that are high in fat and processed sugars, such as fried or sweet foods. Incision care  You may need to have a caregiver help you with wound care and dressing changes. Follow instructions from your health care provider about how to take care of your incision. Make sure you: Wash your hands with soap and water for at least 20 seconds before and after you change your bandage (dressing). If soap and water are not available, use hand sanitizer. Change your dressing as told by your health care provider. Leave stitches (sutures), skin glue, or adhesive strips in place. These skin closures may need to stay in place for 2 weeks or longer. If adhesive strip edges start to loosen and curl up, you may trim the loose edges. Do  not remove adhesive strips completely unless your health care provider tells you to do that. Check your incision area every day for signs of infection. If it is hard to see the area, have someone check for you. Check for: More redness, swelling, or pain. More fluid or blood. Warmth. Pus or a bad smell. Managing pain and swelling If directed, put ice on the affected area. To do this: Put ice in a plastic bag. Place a towel between your skin and the bag. Leave the ice on for 20 minutes, 2-3 times a day. If your skin turns bright red, remove the ice right away to prevent skin damage. The risk of skin damage is higher if you cannot feel pain, heat, or cold. Activity Do not do activities that cause pain or irritate the incision area. These may include bike riding, running, sit ups, or anything that involves a twisting motion. Rest as told by your health care provider. Do not sit for a long time without moving. Get up to take short walks every 1-2 hours. This will improve blood flow and breathing. Ask for help if you feel weak or unsteady. Return to your normal activities as told by your health care provider. Ask your health care provider what activities are safe for you. General instructions Do not use any products that contain nicotine or tobacco. These products include cigarettes, chewing tobacco, and vaping devices, such as e-cigarettes. These can delay healing after surgery. If you need help quitting, ask your health care provider. Do not take baths, swim, or use a hot tub until your health care provider approves.   Ask your health care provider if you may take showers. You may only be allowed to take sponge baths. Keep all follow-up visits. This is important to monitor healing. If you had a procedure with wound packing, your packing may be changed or removed at follow-up visits. Contact a health care provider if: You have pain that does not get better with medicine. You have any of these signs  of infection: More redness, swelling, or pain around your incision. More fluid or blood coming from your incision. Warmth coming from your incision. Pus or a bad smell coming from your incision. A fever. Get help right away if: You have severe pain in your abdomen. You have sudden chest pain and shortness of breath. You cough up blood. You faint or lose consciousness. These symptoms may be an emergency. Get help right away. Call 911. Do not wait to see if the symptoms will go away. Do not drive yourself to the hospital. Summary It is common to have some pain and drainage after your procedure. You may have more drainage if you have an open incision. You may need to have a caregiver help you with wound care and dressing changes. Do not do activities that cause pain or irritate the incision area. Contact your health care provider if you have pain that does not get better with medicine or if you have any signs of infection. This information is not intended to replace advice given to you by your health care provider. Make sure you discuss any questions you have with your health care provider. Document Revised: 08/24/2021 Document Reviewed: 08/24/2021 Elsevier Patient Education  2024 [employer]
